# Patient Record
Sex: Female | Born: 1995 | Race: White | Hispanic: No | Smoking: Never smoker
Health system: Southern US, Community
[De-identification: ages and names within clinical notes are randomized; demographics above are authoritative.]

## PROBLEM LIST (undated history)

## (undated) DIAGNOSIS — N83209 Unspecified ovarian cyst, unspecified side: Secondary | ICD-10-CM

## (undated) DIAGNOSIS — I10 Essential (primary) hypertension: Secondary | ICD-10-CM

## (undated) DIAGNOSIS — F419 Anxiety disorder, unspecified: Secondary | ICD-10-CM

## (undated) DIAGNOSIS — K219 Gastro-esophageal reflux disease without esophagitis: Secondary | ICD-10-CM

## (undated) DIAGNOSIS — K5090 Crohn's disease, unspecified, without complications: Secondary | ICD-10-CM

## (undated) DIAGNOSIS — Z9889 Other specified postprocedural states: Secondary | ICD-10-CM

## (undated) DIAGNOSIS — R112 Nausea with vomiting, unspecified: Secondary | ICD-10-CM

## (undated) HISTORY — DX: Anxiety disorder, unspecified: F41.9

## (undated) HISTORY — PX: FRENULECTOMY, UPPER LABIAL: SHX1683

## (undated) HISTORY — DX: Nausea with vomiting, unspecified: R11.2

## (undated) HISTORY — PX: DRUG INDUCED ENDOSCOPY: SHX6808

## (undated) HISTORY — DX: Crohn's disease, unspecified, without complications: K50.90

## (undated) HISTORY — DX: Essential (primary) hypertension: I10

## (undated) HISTORY — DX: Nausea with vomiting, unspecified: Z98.890

## (undated) HISTORY — PX: COLONOSCOPY: SHX174

---
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
## 2013-12-01 ENCOUNTER — Telehealth: Payer: Self-pay | Admitting: Cardiology

## 2013-12-01 DIAGNOSIS — I119 Hypertensive heart disease without heart failure: Secondary | ICD-10-CM

## 2013-12-01 DIAGNOSIS — I10 Essential (primary) hypertension: Secondary | ICD-10-CM

## 2013-12-01 MED ORDER — METOPROLOL SUCCINATE ER 25 MG PO TB24: 25.0000 mg | ORAL_TABLET | Freq: Every day | ORAL | Status: DC

## 2013-12-01 NOTE — Telephone Encounter (Signed)
New Message  Pt states that her BP was fine for a while... However she has had terrible headaches recently and has found that her BP has risen to 134/108 as of last night.. Patient is requesting a call back.

## 2013-12-01 NOTE — Telephone Encounter (Signed)
Patient treated a couple of weeks ago for costochondritis with injection and Naproxen for about 4 days. Blood pressure has been good and last night went up. Today blood pressure 129/98 HR 119, last night HR 125. Patient continues to have headache. She states she has had a strange tingling/numb/pain feeling in legs. Will forward to  Dr. Mare Ferrari for review

## 2013-12-01 NOTE — Telephone Encounter (Signed)
Left message to call back  

## 2013-12-01 NOTE — Telephone Encounter (Signed)
When advised of  Dr. Sherryl Barters recommendation patient stated she had been off her Metoprolol for about a couple of months. Discussed with  Dr. Mare Ferrari and will have patient just restart her Metoprolol Succ 25 mg daily, Rx sent to pharmacy. Advised patient, verbalized understanding.

## 2013-12-01 NOTE — Telephone Encounter (Signed)
Would increase the Toprol to twice a day until blood pressure returns to normal.  The Naprosyn may have caused the blood pressure to go up temporarily.

## 2014-10-12 ENCOUNTER — Encounter (HOSPITAL_COMMUNITY): Payer: Self-pay | Admitting: *Deleted

## 2014-10-12 ENCOUNTER — Emergency Department (HOSPITAL_COMMUNITY): Payer: BLUE CROSS/BLUE SHIELD

## 2014-10-12 ENCOUNTER — Observation Stay (HOSPITAL_COMMUNITY)
Admission: EM | Admit: 2014-10-12 | Discharge: 2014-10-15 | Disposition: A | Discharge status: Home / Self Care | Payer: BLUE CROSS/BLUE SHIELD | Attending: Family Medicine | Admitting: Family Medicine

## 2014-10-12 DIAGNOSIS — Z79899 Other long term (current) drug therapy: Secondary | ICD-10-CM | POA: Insufficient documentation

## 2014-10-12 DIAGNOSIS — I10 Essential (primary) hypertension: Secondary | ICD-10-CM | POA: Diagnosis not present

## 2014-10-12 DIAGNOSIS — K219 Gastro-esophageal reflux disease without esophagitis: Secondary | ICD-10-CM | POA: Insufficient documentation

## 2014-10-12 DIAGNOSIS — K811 Chronic cholecystitis: Secondary | ICD-10-CM | POA: Diagnosis not present

## 2014-10-12 DIAGNOSIS — R1010 Upper abdominal pain, unspecified: Secondary | ICD-10-CM | POA: Diagnosis not present

## 2014-10-12 DIAGNOSIS — R911 Solitary pulmonary nodule: Secondary | ICD-10-CM | POA: Insufficient documentation

## 2014-10-12 DIAGNOSIS — R1011 Right upper quadrant pain: Secondary | ICD-10-CM | POA: Diagnosis not present

## 2014-10-12 DIAGNOSIS — R197 Diarrhea, unspecified: Secondary | ICD-10-CM | POA: Diagnosis present

## 2014-10-12 DIAGNOSIS — R112 Nausea with vomiting, unspecified: Secondary | ICD-10-CM

## 2014-10-12 HISTORY — DX: Gastro-esophageal reflux disease without esophagitis: K21.9

## 2014-10-12 HISTORY — DX: Unspecified ovarian cyst, unspecified side: N83.209

## 2014-10-12 LAB — CBC WITH DIFFERENTIAL/PLATELET
BASOS PCT: 0 % (ref 0–1)
Basophils Absolute: 0 10*3/uL (ref 0.0–0.1)
EOS ABS: 0 10*3/uL (ref 0.0–0.7)
EOS PCT: 0 % (ref 0–5)
HCT: 40.4 % (ref 36.0–46.0)
Hemoglobin: 13.5 g/dL (ref 12.0–15.0)
LYMPHS ABS: 1 10*3/uL (ref 0.7–4.0)
Lymphocytes Relative: 7 % — ABNORMAL LOW (ref 12–46)
MCH: 28.2 pg (ref 26.0–34.0)
MCHC: 33.4 g/dL (ref 30.0–36.0)
MCV: 84.5 fL (ref 78.0–100.0)
MONO ABS: 1 10*3/uL (ref 0.1–1.0)
MONOS PCT: 6 % (ref 3–12)
NEUTROS ABS: 12.9 10*3/uL — AB (ref 1.7–7.7)
Neutrophils Relative %: 87 % — ABNORMAL HIGH (ref 43–77)
PLATELETS: 338 10*3/uL (ref 150–400)
RBC: 4.78 MIL/uL (ref 3.87–5.11)
RDW: 13.2 % (ref 11.5–15.5)
WBC: 15 10*3/uL — ABNORMAL HIGH (ref 4.0–10.5)

## 2014-10-12 LAB — URINALYSIS, ROUTINE W REFLEX MICROSCOPIC
BILIRUBIN URINE: NEGATIVE
Glucose, UA: NEGATIVE mg/dL
HGB URINE DIPSTICK: NEGATIVE
Ketones, ur: >80 mg/dL — AB
Leukocytes, UA: NEGATIVE
NITRITE: NEGATIVE
PROTEIN: NEGATIVE mg/dL
SPECIFIC GRAVITY, URINE: 1.016 (ref 1.005–1.030)
Urobilinogen, UA: 0.2 mg/dL (ref 0.0–1.0)
pH: 7.5 (ref 5.0–8.0)

## 2014-10-12 LAB — RAPID URINE DRUG SCREEN, HOSP PERFORMED
AMPHETAMINES: NOT DETECTED
BENZODIAZEPINES: NOT DETECTED
Barbiturates: NOT DETECTED
COCAINE: NOT DETECTED
Opiates: POSITIVE — AB
Tetrahydrocannabinol: POSITIVE — AB

## 2014-10-12 LAB — COMPREHENSIVE METABOLIC PANEL
ALK PHOS: 62 U/L (ref 39–117)
ALT: 21 U/L (ref 0–35)
AST: 19 U/L (ref 0–37)
Albumin: 4.1 g/dL (ref 3.5–5.2)
Anion gap: 7 (ref 5–15)
BILIRUBIN TOTAL: 0.8 mg/dL (ref 0.3–1.2)
BUN: 8 mg/dL (ref 6–23)
CALCIUM: 9 mg/dL (ref 8.4–10.5)
CHLORIDE: 107 mmol/L (ref 96–112)
CO2: 24 mmol/L (ref 19–32)
CREATININE: 0.79 mg/dL (ref 0.50–1.10)
GFR calc Af Amer: >90 mL/min (ref >90)
Glucose, Bld: 102 mg/dL — ABNORMAL HIGH (ref 70–99)
Potassium: 3.8 mmol/L (ref 3.5–5.1)
Sodium: 138 mmol/L (ref 135–145)
Total Protein: 6.8 g/dL (ref 6.0–8.3)

## 2014-10-12 LAB — HCG, QUANTITATIVE, PREGNANCY

## 2014-10-12 LAB — LIPASE, BLOOD: Lipase: 32 U/L (ref 11–59)

## 2014-10-12 LAB — POC URINE PREG, ED: Preg Test, Ur: NEGATIVE

## 2014-10-12 MED ORDER — ACETAMINOPHEN 325 MG PO TABS
650.0000 mg | ORAL_TABLET | Freq: Four times a day (QID) | ORAL | Status: DC | PRN
  Administered 2014-10-13: 650 mg via ORAL
  Filled 2014-10-12: qty 2

## 2014-10-12 MED ORDER — SODIUM CHLORIDE 0.9 % IV SOLN
Freq: Once | INTRAVENOUS | Status: AC
Start: 2014-10-12 — End: 2014-10-12
  Administered 2014-10-12: 09:00:00 via INTRAVENOUS

## 2014-10-12 MED ORDER — BOOST / RESOURCE BREEZE PO LIQD
1.0000 | Freq: Three times a day (TID) | ORAL | Status: DC
  Administered 2014-10-12 – 2014-10-14 (×3): 1 via ORAL

## 2014-10-12 MED ORDER — PANTOPRAZOLE SODIUM 20 MG PO TBEC
20.0000 mg | DELAYED_RELEASE_TABLET | Freq: Every day | ORAL | Status: DC
  Administered 2014-10-12 – 2014-10-15 (×2): 20 mg via ORAL
  Filled 2014-10-12 (×4): qty 1

## 2014-10-12 MED ORDER — HYDROMORPHONE HCL 1 MG/ML IJ SOLN
1.0000 mg | Freq: Once | INTRAMUSCULAR | Status: AC
  Administered 2014-10-12: 1 mg via INTRAVENOUS
  Filled 2014-10-12: qty 1

## 2014-10-12 MED ORDER — ONDANSETRON HCL 4 MG PO TABS
4.0000 mg | ORAL_TABLET | Freq: Four times a day (QID) | ORAL | Status: DC | PRN
  Administered 2014-10-14: 4 mg via ORAL
  Filled 2014-10-12: qty 1

## 2014-10-12 MED ORDER — ALUM & MAG HYDROXIDE-SIMETH 200-200-20 MG/5ML PO SUSP: 30.0000 mL | Freq: Four times a day (QID) | ORAL | Status: DC | PRN

## 2014-10-12 MED ORDER — SODIUM CHLORIDE 0.9 % IV SOLN
INTRAVENOUS | Status: AC
  Administered 2014-10-12: 100 mL/h via INTRAVENOUS

## 2014-10-12 MED ORDER — ONDANSETRON HCL 4 MG/2ML IJ SOLN
4.0000 mg | Freq: Four times a day (QID) | INTRAMUSCULAR | Status: DC | PRN
  Administered 2014-10-13 – 2014-10-15 (×2): 4 mg via INTRAVENOUS
  Filled 2014-10-12 (×2): qty 2

## 2014-10-12 MED ORDER — MORPHINE SULFATE 4 MG/ML IJ SOLN
4.0000 mg | Freq: Once | INTRAMUSCULAR | Status: AC
  Administered 2014-10-12: 4 mg via INTRAVENOUS
  Filled 2014-10-12: qty 1

## 2014-10-12 MED ORDER — ACETAMINOPHEN 650 MG RE SUPP: 650.0000 mg | Freq: Four times a day (QID) | RECTAL | Status: DC | PRN

## 2014-10-12 MED ORDER — SODIUM CHLORIDE 0.9 % IV SOLN
Freq: Once | INTRAVENOUS | Status: AC
  Administered 2014-10-12: 13:00:00 via INTRAVENOUS

## 2014-10-12 MED ORDER — HEPARIN SODIUM (PORCINE) 5000 UNIT/ML IJ SOLN
5000.0000 [IU] | Freq: Three times a day (TID) | INTRAMUSCULAR | Status: DC
  Administered 2014-10-12 – 2014-10-14 (×4): 5000 [IU] via SUBCUTANEOUS
  Filled 2014-10-12 (×7): qty 1

## 2014-10-12 MED ORDER — METOCLOPRAMIDE HCL 5 MG/ML IJ SOLN
10.0000 mg | Freq: Once | INTRAMUSCULAR | Status: AC
  Administered 2014-10-12: 10 mg via INTRAVENOUS
  Filled 2014-10-12: qty 2

## 2014-10-12 MED ORDER — ONDANSETRON HCL 4 MG/2ML IJ SOLN
4.0000 mg | Freq: Once | INTRAMUSCULAR | Status: AC
Start: 2014-10-12 — End: 2014-10-12
  Administered 2014-10-12: 4 mg via INTRAVENOUS
  Filled 2014-10-12: qty 2

## 2014-10-12 MED ORDER — DICYCLOMINE HCL 10 MG PO CAPS
10.0000 mg | ORAL_CAPSULE | Freq: Three times a day (TID) | ORAL | Status: DC
  Administered 2014-10-12 – 2014-10-14 (×3): 10 mg via ORAL
  Filled 2014-10-12 (×11): qty 1

## 2014-10-12 MED ORDER — MORPHINE SULFATE 2 MG/ML IJ SOLN
1.0000 mg | INTRAMUSCULAR | Status: DC | PRN
  Administered 2014-10-12 – 2014-10-13 (×2): 1 mg via INTRAVENOUS
  Filled 2014-10-12 (×2): qty 1

## 2014-10-12 NOTE — ED Notes (Signed)
Pt able to tolerate fluids.  Denies N/V

## 2014-10-12 NOTE — ED Notes (Signed)
Returned from xray

## 2014-10-12 NOTE — Consult Note (Signed)
Referring Provider: No ref. provider found Primary Care Physician:  Karma Greaser, DO Primary Gastroenterologist:  Dr. Dorcas Mcmurray [facility] Medicine Teaching Service)  Reason for Consultation:  Nausea, vomiting, abdominal pain, diarrhea  HPI: Natalie Holloway is a 19 y.o. female admitted to the emergency room today because of a three-week history of the above symptoms.  The patient states that she's had stomach symptoms most of her life. Throughout high school, for example, she would only have one bowel movement per week, and it would often be liquid in character. For the past several years, she has had significant heartburn, both after meals and nocturnally, including regurgitation when she would bend over, as well as retrosternal pyrosis. This would be treated with over-the-counter antacids, such as Tums.  With that background, the patient became more acutely ill about 3 weeks ago. Since that time, she has not had any formed bowel movements, and has been having diarrhea with between 5 and 10 liquid nonbloody bowel movements per day. She has had upper abdominal pain, in the epigastric area, somewhat crampy or squeezing in character, more or less continuously present. During this period of time, she has been essentially unable to eat, and she has been having recurrent bilious vomiting.  The patient was seen in an outside emergency room 2 days ago, at which time a CT scan of the abdomen and pelvis was unrevealing. Because of ongoing symptoms, she came to the emergency room here today, where an abdominal ultrasound was negative for stones, white count was moderately elevated at 15,000, but other labs were normal including liver chemistries and lipase.  Stool studies to check for infectious pathogen such as C. difficile have not yet been obtained, but the patient does indicate she was treated briefly with antibiotics for about 5 days for bronchitis, approximately a month ago.  The patient points out that  her maternal grandmother, her maternal great-grandmother, and several maternal aunts have a history of gallbladder trouble. However, the patient's mother has not had any gallbladder issues.   Past Medical History  Diagnosis Date  . Essential hypertension     History reviewed. No pertinent past surgical history.  Prior to Admission medications   Medication Sig Start Date End Date Taking? Authorizing Provider  dicyclomine (BENTYL) 10 MG capsule Take 10 mg by mouth 3 (three) times daily. 09/27/14  Yes Historical Provider, MD  famotidine (PEPCID) 20 MG tablet Take 20 mg by mouth 2 (two) times daily. 10/10/14 10/10/15 Yes Historical Provider, MD  omeprazole (PRILOSEC) 10 MG capsule Take 10 mg by mouth daily.   Yes Historical Provider, MD  promethazine (PHENERGAN) 25 MG tablet Take 25 mg by mouth every 8 (eight) hours. 10/10/14  Yes Historical Provider, MD  metoprolol succinate (TOPROL XL) 25 MG 24 hr tablet Take 1 tablet (25 mg total) by mouth daily. Patient not taking: Reported on 10/12/2014 12/01/13   Darlin Coco, MD    Current Facility-Administered Medications  Medication Dose Route Frequency Provider Last Rate Last Dose  . 0.9 %  sodium chloride infusion   Intravenous Continuous Lorna Few, DO 100 mL/hr at 10/12/14 1815 100 mL/hr at 10/12/14 1815  . acetaminophen (TYLENOL) tablet 650 mg  650 mg Oral Q6H PRN Lorna Few, DO       Or  . acetaminophen (TYLENOL) suppository 650 mg  650 mg Rectal Q6H PRN [place] N Rumley, DO      . alum & mag hydroxide-simeth (MAALOX/MYLANTA) 200-200-20 MG/5ML suspension 30 mL  30 mL Oral Q6H  PRN [employer], DO      . dicyclomine (BENTYL) capsule 10 mg  10 mg Oral TID [facility], DO   10 mg at 10/12/14 1800  . heparin injection 5,000 Units  5,000 Units Subcutaneous 3 times per day [facility], DO      . morphine 2 MG/ML injection 1 mg  1 mg Intravenous Q3H PRN Lorna Few, DO   1 mg at 10/12/14 1841  . ondansetron (ZOFRAN) tablet  4 mg  4 mg Oral Q6H PRN [place] N Rumley, DO       Or  . ondansetron (ZOFRAN) injection 4 mg  4 mg Intravenous Q6H PRN [place] N Rumley, DO      . pantoprazole (PROTONIX) EC tablet 20 mg  20 mg Oral Daily [place], DO        Allergies as of 10/12/2014  . (No Known Allergies)    No family history on file.  History   Social History  . Marital Status: Single    Spouse Name: N/A    Number of Children: N/A  . Years of Education: N/A   Occupational History  . Not on file.   Social History Main Topics  . Smoking status: Never Smoker   . Smokeless tobacco: Not on file  . Alcohol Use: No  . Drug Use: No  . Sexual Activity: Not on file   Other Topics Concern  . Not on file   Social History Narrative    Review of Systems: Please see history of present illness  Physical Exam: Vital signs in last 24 hours: Temp:  [97.8 F (36.6 C)-99.3 F (37.4 C)] 99.3 F (37.4 C) (02/03 1826) Pulse Rate:  [68-102] 81 (02/03 1826) Resp:  [14-22] 16 (02/03 1826) BP: (110-146)/(66-99) 120/79 mmHg (02/03 1826) SpO2:  [97 %-100 %] 99 % (02/03 1826) Weight:  [73.8 kg (162 lb 11.2 oz)] 73.8 kg (162 lb 11.2 oz) (02/03 1826) Last BM Date: 10/12/14 General:   Alert,  Well-developed, well-nourished, pleasant and cooperative in NAD Head:  Normocephalic and atraumatic. Eyes:  Sclera clear, no icterus.   Conjunctiva pink. Mouth:   No ulcerations or lesions.  Oropharynx pink & moist. Neck:   No masses or thyromegaly. Lungs:  Clear throughout to auscultation.   No wheezes, crackles, or rhonchi. No evident respiratory distress. Heart:   Regular rate and rhythm; no murmurs, clicks, rubs,  or gallops. Abdomen:  Soft, but with mild subjective upper abdominal tenderness, nontympanitic, and nondistended. No masses, hepatosplenomegaly or ventral hernias noted. Quiet bowel sounds s/p morphine, without bruits, guarding, or rebound.   Msk:   Symmetrical without gross deformities. Pulses:  Normal radial  pulses noted. Extremities:   Without clubbing, cyanosis, or edema. Neurologic:  Alert and coherent;  grossly normal neurologically. Skin:  Intact without significant lesions or rashes. Cervical Nodes:  No significant cervical adenopathy. Psych:   Alert and cooperative. Normal mood and affect.  Intake/Output from previous day:   Intake/Output this shift:    Lab Results:  Recent Labs  10/12/14 0920  WBC 15.0*  HGB 13.5  HCT 40.4  PLT 338   BMET  Recent Labs  10/12/14 1235  NA 138  K 3.8  CL 107  CO2 24  GLUCOSE 102*  BUN 8  CREATININE 0.79  CALCIUM 9.0   LFT  Recent Labs  10/12/14 1235  PROT 6.8  ALBUMIN 4.1  AST 19  ALT 21  ALKPHOS 62  BILITOT 0.8  PT/INR No results for input(s): LABPROT, INR in the last 72 hours.  Studies/Results: Dg Chest 2 View  10/12/2014   CLINICAL DATA:  generalized abd pain and nausea for 3 weeks. Worse for the past few days. She was seen at [facility] and told that it was her gallbladder.  EXAM: CHEST - 2 VIEW  COMPARISON:  08/21/2012  FINDINGS: Lungs are clear. Heart size and mediastinal contours are within normal limits. No effusion. Visualized skeletal structures are unremarkable.  IMPRESSION: No acute cardiopulmonary disease.   Electronically Signed   By: Arne Cleveland M.D.   On: 10/12/2014 12:21   US Abdomen Complete  10/12/2014   CLINICAL DATA:  Right upper quadrant abdominal pain for 3 weeks with nausea and vomiting. Initial encounter.  EXAM: ULTRASOUND ABDOMEN COMPLETE  COMPARISON:  Abdominal ultrasound and CT 10/04/2010.  FINDINGS: Gallbladder: No gallstones or wall thickening visualized. No sonographic Murphy sign noted.  Common bile duct: Diameter: 1.8 mm. No evidence of choledocholithiasis.  Liver: No focal lesion identified. Within normal limits in parenchymal echogenicity.  IVC: No abnormality visualized.  Pancreas: Visualized portion unremarkable.  Spleen: Size and appearance within normal limits.  Right Kidney: Length: 11.1  cm. Echogenicity within normal limits. No mass or hydronephrosis visualized.  Left Kidney: Length: 11.8 cm. Echogenicity within normal limits. No mass or hydronephrosis visualized.  Abdominal aorta: No aneurysm visualized.  Other findings: None.  IMPRESSION: Normal abdominal ultrasound.   Electronically Signed   By: Camie Patience M.D.   On: 10/12/2014 11:15    Impression: 1. Nausea and vomiting (bilious, not food related, but exacerbated by eating) 2. Upper abdominal pain 3. Diarrhea of 3 weeks' duration, nonbloody 4. History of reflux, with both heartburn and regurgitation 5. Previous history of significant constipation characterized by in frequent bowel movements  Discussion:  I am having trouble putting all of this patient's clinical presentation into a single, a unifying diagnosis. For example, one might wonder about gallbladder disease to account for her upper abdominal pain, but going against that is her young age, the absence of stones on ultrasound, and the persistence of symptoms rather than sporadic her episodic attacks. One might wonder about C. difficile enterocolitis which could give her nausea, vomiting, and diarrhea, but going against that is the minimal antecedent exposure to antibiotics and the absence of colitis on her outside CT scan.  Plan: 1. Obtain beta hCG pregnancy test as confirmation of the absence of pregnancy to account for her nausea and vomiting 2. CCK stimulated hepatobiliary scan tomorrow morning to help look for possible gallbladder dysfunction as a cause of her symptoms (acalculous biliary tract disease, biliary dyskinesia, chronic cholecystitis, cholesterolosis). 3. Await results of stool studies for Clostridium difficile and enteric pathogens 4. If the above studies are unrevealing, consider endoscopic and possibly colonoscopic evaluation with random mucosal biopsies. For example, I once saw patient with a somewhat similar presentation who had lymphocytic  gastroenteritis and lymphocytic colitis presenting in a somewhat similar fashion.   LOS: 0 days   Ennio Houp V  10/12/2014, 7:29 PM

## 2014-10-12 NOTE — H&P (Signed)
[facility] Admission History and Physical Service Pager: [phone]  Patient name: Natalie Holloway Medical record number: [id number] Date of birth: [birth year] Age: 19 y.o. Gender: female  Primary Care Provider: Karma Greaser, DO Consultants: Gastroenterology Code Status: Full  Chief Complaint: Abdominal Pain/Nausea/Vomiting/Diarrhea  Assessment and Plan: Natalie Holloway is a 19 y.o. female presenting with RUQ abdominal pain, nausea, vomiting, and diarrhea. PMH is significant for HTN, which has since resolved (thought to be d/t OCPs), GERD. Note - significant reported family history of GB disease.  # RUQ Abdominal Pain / Nausea / Vomiting: Three week history worse over last three days, not tolerating any PO. Clinically mild dehydration, uncomfortable but some improvement s/p IVF and pain control in ER. Vitals normal. Electrolytes normal. Glucose 102 and ketones in urine. WBC 15. Urine pregnancy negative. Urinalysis negative. CXR with no acute cardiopulmonary disease. US abdomen normal (no gallstones or wall thickening, neg [place] murphy's). Recently seen at [facility] ER on 2/1 with WBC 13.7, Glucose elevated at 139, ketones in urine, lipase normal, and anion gap 20. CT Abd/Pelvis showed no findings for acute abdominal pain (no gallstones) but also showed a small 18m pulmonary nodule with recommendations to follow up in 12 months; no calculi or hydronephrosis noted. Overall, suspected biliary disease, however unlikely cholecystitis and no cholelithiaisis identified. Possible gastroenteritis with some loose stools (>3 wk course and localized RUQ pain seems unlikely). - Admit to observation - Ordered confirmatory Beta HCG serum pregnancy test to confirm (urine preg negative) - Clear liquid diet as tolerated, NPO after MN - Follow up am labs - Follow up UDS (denied drug use, check THC for recurrent n/v) - Bentyl 146mTID - Protonix 2047m Tylenol PRN fever/mild pain. Morphine  1q3hr PRN moderate to severe pain. - Zofran PRN nausea, Maalox/Mylanta PRN indigestion - GI consulted. Appreciate recommendations. - [facility] - To see patient tonight 10/12/14. Given persistent typical biliary symptoms with negative work-up recently, especially no gallstones on US,Kore[place]commend pursuing HIDA scan to evaluate for acalculus biliary disease / GB function. Per request, ordered HIDA for 2/4 at 0800 (NPO after MN, avoid opiates 6 hr prior to test), if abnormal, given significant family hx biliary disease, would likely recommend surgical consultation for possible cholecystectomy.  FEN/GI: Clear Liquid Diet >> NPO after MN, NS @ 100 (s/p 2L IVF in ER) Prophylaxis: SQ Heparin  Disposition: Placed in Observation. Discharge home pending improvement of symptoms.  History of Present Illness: Natalie Holloway a [age]o. female presenting with three week history of abdominal pain, diarrhea, nausea, and vomiting worse over the last two days. Describes abdominal pain as sharp in nature, located in RUQ and epigastric area and penetrating to her back. Pain is non-radiating. States pain was intermittent until Monday (10/10/14) when it became constant. Describes vomitus as bilious with one episode of coffee ground color a few days ago. Denies frank blood. Symptoms improved yesterday with clear liquid diet, however they returned this morning with food. Denies history of sick contacts. Denies history of fever, however admits to chills today, dysuria, hematuria. Has not been able to keep anything down today. Was evaluated by nurse at her school who believed she may have IBS. Denies history of alcohol, drug, or tobacco use. Last menstrual period two weeks ago. Strong family history of gallbladder problems.  [facility]. Instructed to follow up with GI and try clear liquid diet until symptoms  resolved.  Review Of Systems: Per HPI   Otherwise 12 point review of systems was performed and was unremarkable.  Patient Active Problem List   Diagnosis Date Noted  . Essential hypertension 08/21/2012   Past Medical History: Past Medical History  Diagnosis Date  . Essential hypertension    Past Surgical History: History reviewed. No pertinent past surgical history. Social History: History  Substance Use Topics  . Smoking status: Never Smoker   . Smokeless tobacco: Not on file  . Alcohol Use: No   Please also refer to relevant sections of EMR.  Family History: No family history on file. Allergies and Medications: No Known Allergies No current facility-administered medications on file prior to encounter.   Current Outpatient Prescriptions on File Prior to Encounter  Medication Sig Dispense Refill  . metoprolol succinate (TOPROL XL) 25 MG 24 hr tablet Take 1 tablet (25 mg total) by mouth daily. (Patient not taking: Reported on 10/12/2014) 30 tablet 5    Objective: BP 121/75 mmHg  Pulse 94  Temp(Src) 97.8 F (36.6 C) (Oral)  Resp 14  SpO2 97%  LMP 09/28/2014 Exam: General: 19yo female resting comfortably in no apparent distress HEENT: Mild dry mucous membranes. PERRLA. Cardiovascular: S1 and S2 noted. No murmurs/rubs/gallops. Regular rate and rhythm. Respiratory: Clear to auscultation bilaterally. No wheezes noted. No increased work of breathing. Abdomen: Bowel sounds noted. Soft and nondistended. Tenderness in RUQ and epigastric area, equivocal Murphy's sign w/o overt worsening pain. No rebound tenderness or Rovsing's sign noted. Right CVA tenderness.  Extremities: No edema noted. Pulses palpated. Skin: No rashes noted. Warm. Neuro: No focal deficits noted. AAO x4  Labs and Imaging: CBC BMET   Recent Labs Lab 10/12/14 0920  WBC 15.0*  HGB 13.5  HCT 40.4  PLT 338    Recent Labs Lab 10/12/14 1235  NA 138  K 3.8  CL 107  CO2 24  BUN 8  CREATININE 0.79  GLUCOSE 102*  CALCIUM 9.0     . Urinalysis    Component Value Date/Time   COLORURINE YELLOW 10/12/2014 1017   APPEARANCEUR CLEAR 10/12/2014 1017   LABSPEC 1.016 10/12/2014 1017   PHURINE 7.5 10/12/2014 1017   GLUCOSEU NEGATIVE 10/12/2014 1017   HGBUR NEGATIVE 10/12/2014 1017   BILIRUBINUR NEGATIVE 10/12/2014 1017   KETONESUR >80* 10/12/2014 1017   PROTEINUR NEGATIVE 10/12/2014 1017   UROBILINOGEN 0.2 10/12/2014 1017   NITRITE NEGATIVE 10/12/2014 1017   LEUKOCYTESUR NEGATIVE 10/12/2014 1017  - Urine pregnancy negative  10/12/14 [place] Abd (complete) IMPRESSION: Normal abdominal ultrasound.  10/12/14 CXR 2v IMPRESSION: No acute cardiopulmonary disease.  [place], [place] 10/12/2014, 4:03 PM PGY-1, [place] Intern pager: [phone], text pages welcome  Upper Level Addendum:  I have seen and evaluated this patient along with Dr. Gerlean Ren and reviewed the above note, making necessary revisions in purple.

## 2014-10-12 NOTE — ED Notes (Signed)
Pt ambulated to the bathroom without assistance.

## 2014-10-12 NOTE — ED Notes (Signed)
Gave pt water to drink for PO trial

## 2014-10-12 NOTE — ED Notes (Signed)
Off unit with Xray.

## 2014-10-12 NOTE — ED Notes (Signed)
Patient states she has abd pain and nausea for 3 weeks.  Worse for the past few days.  She was seen at [facility] and told that it was her gallbladder  Patient pain has increased and she is tearful.  Patient has been eating bland diet.  Patient has had 5-6 emesis today.  Patient states her emesis is green.  Patient has also had diarrhea,.  Patient has not had anything for pain

## 2014-10-12 NOTE — Progress Notes (Signed)
Natalie Holloway [id number] Admission Data: 10/12/2014 6:35 PM Attending Provider: Dickie La, MD  TVG:[id number],YOOJZ, DO Consults/ Treatment Team:    Natalie Holloway is a 19 y.o. female patient admitted from ED awake, alert  & orientated  X 3,  Full Code, VSS - Blood pressure 120/79, pulse 81, temperature 99.3 F (37.4 C), temperature source Oral, resp. rate 16, height 5' 2"  (1.575 m), weight 73.8 kg (162 lb 11.2 oz), last menstrual period 09/28/2014, SpO2 99 %.,  no c/o shortness of breath, no c/o chest pain, no distress noted.   IV site WDL:  Right hand running NS at 130m/hour.  Allergies:  No Known Allergies   Past Medical History  Diagnosis Date  . Essential hypertension       Pt orientation to unit, room and routine. Information packet given to patient/family and safety video watched.  Admission INP armband ID verified with patient/family, and in place. SR up x 2, fall risk assessment complete with Patient and family verbalizing understanding of risks associated with falls. Pt verbalizes an understanding of how to use the call bell and to call for help before getting out of bed.  Skin, clean-dry- intact without evidence of bruising, or skin tears.   No evidence of skin break down noted on exam.     Will cont to monitor and assist as needed.  BDayle Points RN 10/12/2014 6:35 PM

## 2014-10-12 NOTE — ED Notes (Signed)
Patient continues to have dry heaves and pain.  Patient mother is at bedside.  Patient ready to attempt to provide urine specimen

## 2014-10-12 NOTE — ED Provider Notes (Signed)
CSN: [id number]     Arrival date & time 10/12/14  0900 History   First MD Initiated Contact with Patient 10/12/14 [phone]     Chief Complaint  Patient presents with  . Abdominal Pain  . Emesis     (Consider location/radiation/quality/duration/timing/severity/associated sxs/prior Treatment) HPI   19 year old female with history of essential hypertension who presents for evaluation of abdominal pain. Patient has had intermittent right upper quadrant abdominal pain and nausea for the past 3 weeks. Pain is described as a sharp sensation, radiates to her back, worsening with eating. She endorsed feeling nauseous, and has vomited multiple times. Vomitus is bilious in content, nonbloody. Also endorsed having loose stools without any blood or mucus. Endorsed chills, lightheadedness and dizziness. She was seen at an outside hospital 2 days ago for this complaint. She had an abdominal CT scan and was told that her pain is likely gallbladder related. She was discharge with antinausea medication and pain medication and a referral to GI. Her appointment is 2 weeks later however her symptoms have progressively worse. She is unable to keep any of her medication down. She is here with a 10 out of 10 pain and persistent nausea and vomiting. She is dry heaving. Denies any abdominal surgery, last menstrual period was 2 weeks ago. Denies any dysuria, hematuria, vaginal bleeding or vaginal discharge.  Past Medical History  Diagnosis Date  . Essential hypertension    History reviewed. No pertinent past surgical history. No family history on file. History  Substance Use Topics  . Smoking status: Never Smoker   . Smokeless tobacco: Not on file  . Alcohol Use: No   OB History    No data available     Review of Systems  All other systems reviewed and are negative.     Allergies  Review of patient's allergies indicates no known allergies.  Home Medications   Prior to Admission medications   Medication  Sig Start Date End Date Taking? Authorizing Provider  metoprolol succinate (TOPROL XL) 25 MG 24 hr tablet Take 1 tablet (25 mg total) by mouth daily. 12/01/13   Darlin Coco, MD   BP 130/87 mmHg  Pulse 99  Temp(Src) 97.8 F (36.6 C) (Oral)  Resp 22  SpO2 100% Physical Exam  Constitutional: She is oriented to person, place, and time. She appears well-developed and well-nourished.  Caucasian female, appears very uncomfortable actively vomiting.  HENT:  Head: Atraumatic.  Eyes: Conjunctivae are normal.  Neck: Neck supple.  Cardiovascular: Normal rate and regular rhythm.   Pulmonary/Chest: Effort normal and breath sounds normal. She exhibits no tenderness.  Abdominal: Soft. There is tenderness (Diffuse abdominal tenderness most significant to right upper quadrant with guarding but without rebound tenderness.). There is guarding. There is no rebound.  Decreased bowel sounds.  Genitourinary:  No CVA tenderness.  Neurological: She is alert and oriented to person, place, and time.  Skin: No rash noted.  Psychiatric: She has a normal mood and affect.  Nursing note and vitals reviewed.   ED Course  Procedures (including critical care time)  Patient with right upper quadrant abdominal pain concerning for biliary disease. She was seen at an outside hospital 2 days ago and had an abdominal and pelvis CT scan that shows no acute finding. There was no documentation on her gallbladder on the CT scan. Therefore, we'll obtain abdominal ultrasound for further evaluation. Workup initiated, pain medication and antinausea medication given along with IV fluid.  Care discussed with Dr. Wyvonnia Dusky  1:40 PM  Patient with leukocytosis, WBC of 15 with left shift. She has greater than 80 ketones in urine. The remainder of her labs are reassuring. Chest x-ray without acute abnormalities, ultrasound of abdomen shows no evidence of gallbladder etiology, no other findings noted.  3:32 PM Although patient felt a  bit better after receiving treatments and able to tolerance by mouth. She does not feel comfortable going home stating that her symptoms likely worsen as he did when she was discharged from ER 2 days ago for the same complaint. She also mentioned that her grandmother had an episode of gangrene is biliary disease that was undetected on initial exam and subsequently she got really sick from that. Therefore there was some concern of her symptoms.  3:40 PM i have consulted Family Medicine Resident who agrees to see pt in ER and will determine disposition.     Labs Review Labs Reviewed  CBC WITH DIFFERENTIAL/PLATELET - Abnormal; Notable for the following:    WBC 15.0 (*)    Neutrophils Relative % 87 (*)    Neutro Abs 12.9 (*)    Lymphocytes Relative 7 (*)    All other components within normal limits  URINALYSIS, ROUTINE W REFLEX MICROSCOPIC - Abnormal; Notable for the following:    Ketones, ur >80 (*)    All other components within normal limits  COMPREHENSIVE METABOLIC PANEL - Abnormal; Notable for the following:    Glucose, Bld 102 (*)    All other components within normal limits  LIPASE, BLOOD  POC URINE PREG, ED    Imaging Review Dg Chest 2 View  10/12/2014   CLINICAL DATA:  generalized abd pain and nausea for 3 weeks. Worse for the past few days. She was seen at [facility] and told that it was her gallbladder.  EXAM: CHEST - 2 VIEW  COMPARISON:  08/21/2012  FINDINGS: Lungs are clear. Heart size and mediastinal contours are within normal limits. No effusion. Visualized skeletal structures are unremarkable.  IMPRESSION: No acute cardiopulmonary disease.   Electronically Signed   By: Arne Cleveland M.D.   On: 10/12/2014 12:21   US Abdomen Complete  10/12/2014   CLINICAL DATA:  Right upper quadrant abdominal pain for 3 weeks with nausea and vomiting. Initial encounter.  EXAM: ULTRASOUND ABDOMEN COMPLETE  COMPARISON:  Abdominal ultrasound and CT 10/04/2010.  FINDINGS: Gallbladder: No gallstones  or wall thickening visualized. No sonographic Murphy sign noted.  Common bile duct: Diameter: 1.8 mm. No evidence of choledocholithiasis.  Liver: No focal lesion identified. Within normal limits in parenchymal echogenicity.  IVC: No abnormality visualized.  Pancreas: Visualized portion unremarkable.  Spleen: Size and appearance within normal limits.  Right Kidney: Length: 11.1 cm. Echogenicity within normal limits. No mass or hydronephrosis visualized.  Left Kidney: Length: 11.8 cm. Echogenicity within normal limits. No mass or hydronephrosis visualized.  Abdominal aorta: No aneurysm visualized.  Other findings: None.  IMPRESSION: Normal abdominal ultrasound.   Electronically Signed   By: Camie Patience M.D.   On: 10/12/2014 11:15     EKG Interpretation None      MDM   Final diagnoses:  RUQ abdominal pain  Upper abdominal pain    BP 128/80 mmHg  Pulse 92  Temp(Src) 97.8 F (36.6 C) (Oral)  Resp 14  SpO2 99%  LMP 09/28/2014  I have reviewed nursing notes and vital signs. I personally reviewed the imaging tests through PACS system  I reviewed available ER/hospitalization records thought the EMR     Domenic Moras, [place] 10/12/14 1638  Ezequiel Essex, MD 10/12/14 [phone]

## 2014-10-13 ENCOUNTER — Observation Stay (HOSPITAL_COMMUNITY): Payer: BLUE CROSS/BLUE SHIELD

## 2014-10-13 DIAGNOSIS — R197 Diarrhea, unspecified: Secondary | ICD-10-CM | POA: Diagnosis not present

## 2014-10-13 DIAGNOSIS — R1011 Right upper quadrant pain: Secondary | ICD-10-CM | POA: Diagnosis not present

## 2014-10-13 LAB — CBC
HEMATOCRIT: 35.4 % — AB (ref 36.0–46.0)
Hemoglobin: 12 g/dL (ref 12.0–15.0)
MCH: 27.9 pg (ref 26.0–34.0)
MCHC: 33.9 g/dL (ref 30.0–36.0)
MCV: 82.3 fL (ref 78.0–100.0)
PLATELETS: 280 10*3/uL (ref 150–400)
RBC: 4.3 MIL/uL (ref 3.87–5.11)
RDW: 12.9 % (ref 11.5–15.5)
WBC: 7.3 10*3/uL (ref 4.0–10.5)

## 2014-10-13 LAB — BASIC METABOLIC PANEL
Anion gap: 9 (ref 5–15)
BUN: 6 mg/dL (ref 6–23)
CHLORIDE: 107 mmol/L (ref 96–112)
CO2: 22 mmol/L (ref 19–32)
Calcium: 8.8 mg/dL (ref 8.4–10.5)
Creatinine, Ser: 0.75 mg/dL (ref 0.50–1.10)
GFR calc non Af Amer: >90 mL/min (ref >90)
Glucose, Bld: 94 mg/dL (ref 70–99)
POTASSIUM: 3.5 mmol/L (ref 3.5–5.1)
Sodium: 138 mmol/L (ref 135–145)

## 2014-10-13 LAB — CLOSTRIDIUM DIFFICILE BY PCR: Toxigenic C. Difficile by PCR: NEGATIVE

## 2014-10-13 MED ORDER — TECHNETIUM TC 99M MEBROFENIN IV KIT
5.0000 | PACK | Freq: Once | INTRAVENOUS | Status: AC | PRN
  Administered 2014-10-13: 5 via INTRAVENOUS

## 2014-10-13 MED ORDER — SINCALIDE 5 MCG IJ SOLR
0.0200 ug/kg | Freq: Once | INTRAMUSCULAR | Status: AC
  Administered 2014-10-13: 3.86 ug via INTRAVENOUS

## 2014-10-13 MED ORDER — SINCALIDE 5 MCG IJ SOLR
INTRAMUSCULAR | Status: AC
  Filled 2014-10-13: qty 5

## 2014-10-13 MED ORDER — SODIUM CHLORIDE 0.9 % IV SOLN
INTRAVENOUS | Status: DC
  Administered 2014-10-13: 23:00:00 via INTRAVENOUS

## 2014-10-13 MED ORDER — STERILE WATER FOR INJECTION IJ SOLN
INTRAMUSCULAR | Status: AC
  Administered 2014-10-13: 5 mL
  Filled 2014-10-13: qty 10

## 2014-10-13 MED ORDER — MORPHINE SULFATE 2 MG/ML IJ SOLN
1.0000 mg | INTRAMUSCULAR | Status: DC | PRN
  Administered 2014-10-13 – 2014-10-14 (×4): 1 mg via INTRAVENOUS
  Filled 2014-10-13 (×5): qty 1

## 2014-10-13 NOTE — Progress Notes (Signed)
INITIAL NUTRITION ASSESSMENT  DOCUMENTATION CODES Per approved criteria  -Not Applicable   INTERVENTION: 1. Monitor intake and provide supplements/snacks as warranted  NUTRITION DIAGNOSIS: Inadequate oral intake related to GI distress and upper gastric pain as evidenced by eating ~50-75% of normal intake  Goal: Pt to meet >/= 90% of their estimated nutrition needs   Monitor:  GI distress, test results, diet status, oral intake/output, labs, weight  Reason for Assessment: MST of 2   19 y.o. female  Admitting Dx: <principal problem not specified>  ASSESSMENT: Natalie Holloway is a 19 y.o. female presenting with RUQ abdominal pain, nausea, vomiting, and diarrhea. PMH is significant for HTN, which has since resolved    Pt was not in room when RD arrived; she was in procedure. Spoke with mother and boyfriend. Pt has been eating slightly worse than she regularly was in the past couple weeks. Boyfriend said she only ate half of meals, but mother said this is how she has always been and her overall intake is only slightly worse.  She would get sick when eating too much and would either have pains or diarrhea. Pt would also have episodes of vomiting, especially in the morning. Though she hasnt been eating as much as usual the mother reports that pts weight has remained fairly stable and she has actually gained weight over the past year. Pt not taking any supplements or following any diets at home.   Nutrition Focused Physical Exam: Pt not present   Height: Ht Readings from Last 1 Encounters:  10/12/14 5' 2"  (1.575 m)    Weight: Wt Readings from Last 1 Encounters:  10/12/14 162 lb 11.2 oz (73.8 kg)    Ideal Body Weight: 110 lbs  % Ideal Body Weight: 147%  Wt Readings from Last 10 Encounters:  10/12/14 162 lb 11.2 oz (73.8 kg)  09/18/12 157 lb 12.8 oz (71.578 kg) (88 %*, Z = 1.18)  08/21/12 154 lb (69.854 kg) (86 %*, Z = 1.08)   * Growth percentiles are based on CDC 2-20 Years  data.    Usual Body Weight: ~162  % Usual Body Weight: 100  BMI:  Body mass index is 29.75 kg/(m^2).  Estimated Nutritional Needs: Kcal: 1475-1850 Protein: 74-89 Fluid: >1475 and  Enough to replace fluid lost from diarrhea  Skin: WDL  Diet Order: Diet NPO time specified Except for: Ice Chips  EDUCATION NEEDS: -No education needs identified at this time   Intake/Output Summary (Last 24 hours) at 10/13/14 1148 Last data filed at 10/13/14 0435  Gross per 24 hour  Intake 3061.67 ml  Output    476 ml  Net 2585.67 ml    Last BM: 2/3 diarrhea  Labs:   Recent Labs Lab 10/12/14 1235 10/13/14 0719  NA 138 138  K 3.8 3.5  CL 107 107  CO2 24 22  BUN 8 6  CREATININE 0.79 0.75  CALCIUM 9.0 8.8  GLUCOSE 102* 94    CBG (last 3)  No results for input(s): GLUCAP in the last 72 hours.  Scheduled Meds: . dicyclomine  10 mg Oral TID  . feeding supplement (RESOURCE BREEZE)  1 Container Oral TID BM  . heparin  5,000 Units Subcutaneous 3 times per day  . pantoprazole  20 mg Oral Daily    Continuous Infusions:   Past Medical History  Diagnosis Date  . Essential hypertension     History reviewed. No pertinent past surgical history.  Burtis Junes RD, LDN Nutrition 10/13/2014 11:48 AM  [id number]   

## 2014-10-13 NOTE — Progress Notes (Signed)
[facility] Progress Note  Subjective: Still with epigastric abdominal pain, slightly improved   Objective: Vital signs in last 24 hours: Temp:  [99.2 F (37.3 C)-99.3 F (37.4 C)] 99.2 F (37.3 C) (02/04 0511) Pulse Rate:  [68-102] 87 (02/04 0511) Resp:  [14-20] 18 (02/04 0511) BP: (110-141)/(66-92) 119/74 mmHg (02/04 0511) SpO2:  [97 %-100 %] 98 % (02/04 0511) Weight:  [73.8 kg (162 lb 11.2 oz)] 73.8 kg (162 lb 11.2 oz) (02/03 1826) Weight change:    PE: Abdomen soft mild tenderness in the epigastrium  Lab Results: Results for orders placed or performed during the hospital encounter of 10/12/14 (from the past 24 hour(s))  Comprehensive metabolic panel     Status: Abnormal   Collection Time: 10/12/14 12:35 PM  Result Value Ref Range   Sodium 138 135 - 145 mmol/L   Potassium 3.8 3.5 - 5.1 mmol/L   Chloride 107 96 - 112 mmol/L   CO2 24 19 - 32 mmol/L   Glucose, Bld 102 (H) 70 - 99 mg/dL   BUN 8 6 - 23 mg/dL   Creatinine, Ser 0.79 0.50 - 1.10 mg/dL   Calcium 9.0 8.4 - 10.5 mg/dL   Total Protein 6.8 6.0 - 8.3 g/dL   Albumin 4.1 3.5 - 5.2 g/dL   AST 19 0 - 37 U/L   ALT 21 0 - 35 U/L   Alkaline Phosphatase 62 39 - 117 U/L   Total Bilirubin 0.8 0.3 - 1.2 mg/dL   GFR calc non Af Amer >90 >90 mL/min   GFR calc Af Amer >90 >90 mL/min   Anion gap 7 5 - 15  Lipase, blood     Status: None   Collection Time: 10/12/14 12:35 PM  Result Value Ref Range   Lipase 32 11 - 59 U/L  hCG, quantitative, pregnancy     Status: None   Collection Time: 10/12/14  1:00 PM  Result Value Ref Range   hCG, Beta Chain, Quant, S <1 <5 mIU/mL  Clostridium Difficile by PCR     Status: None   Collection Time: 10/13/14  4:47 AM  Result Value Ref Range   C difficile by pcr NEGATIVE NEGATIVE  Basic metabolic panel     Status: None   Collection Time: 10/13/14  7:19 AM  Result Value Ref Range   Sodium 138 135 - 145 mmol/L   Potassium 3.5 3.5 - 5.1 mmol/L   Chloride 107 96 - 112 mmol/L   CO2 22 19 - 32 mmol/L   Glucose, Bld 94 70 - 99 mg/dL   BUN 6 6 - 23 mg/dL   Creatinine, Ser 0.75 0.50 - 1.10 mg/dL   Calcium 8.8 8.4 - 10.5 mg/dL   GFR calc non Af Amer >90 >90 mL/min   GFR calc Af Amer >90 >90 mL/min   Anion gap 9 5 - 15  CBC     Status: Abnormal   Collection Time: 10/13/14  7:19 AM  Result Value Ref Range   WBC 7.3 4.0 - 10.5 K/uL   RBC 4.30 3.87 - 5.11 MIL/uL   Hemoglobin 12.0 12.0 - 15.0 g/dL   HCT 35.4 (L) 36.0 - 46.0 %   MCV 82.3 78.0 - 100.0 fL   MCH 27.9 26.0 - 34.0 pg   MCHC 33.9 30.0 - 36.0 g/dL   RDW 12.9 11.5 - 15.5 %   Platelets 280 150 - 400 K/uL    Studies/Results: Dg Chest 2 View  10/12/2014   CLINICAL DATA:  generalized abd pain and nausea for 3 weeks. Worse for the past few days. She was seen at [facility] and told that it was her gallbladder.  EXAM: CHEST - 2 VIEW  COMPARISON:  08/21/2012  FINDINGS: Lungs are clear. Heart size and mediastinal contours are within normal limits. No effusion. Visualized skeletal structures are unremarkable.  IMPRESSION: No acute cardiopulmonary disease.   Electronically Signed   By: Arne Cleveland M.D.   On: 10/12/2014 12:21   US Abdomen Complete  10/12/2014   CLINICAL DATA:  Right upper quadrant abdominal pain for 3 weeks with nausea and vomiting. Initial encounter.  EXAM: ULTRASOUND ABDOMEN COMPLETE  COMPARISON:  Abdominal ultrasound and CT 10/04/2010.  FINDINGS: Gallbladder: No gallstones or wall thickening visualized. No sonographic Murphy sign noted.  Common bile duct: Diameter: 1.8 mm. No evidence of choledocholithiasis.  Liver: No focal lesion identified. Within normal limits in parenchymal echogenicity.  IVC: No abnormality visualized.  Pancreas: Visualized portion unremarkable.  Spleen: Size and appearance within normal limits.  Right Kidney: Length: 11.1 cm. Echogenicity within normal limits. No mass or hydronephrosis visualized.  Left Kidney: Length: 11.8 cm. Echogenicity within normal limits. No mass or  hydronephrosis visualized.  Abdominal aorta: No aneurysm visualized.  Other findings: None.  IMPRESSION: Normal abdominal ultrasound.   Electronically Signed   By: Camie Patience M.D.   On: 10/12/2014 11:15      Assessment: Epigastric abdominal pain nausea vomiting and some diarrhea, with strong family history of gallbladder disease.  Plan: Await HIDA scan to be done later today. If unrevealing tentatively plan EGD for tomorrow.    Natalie Holloway C 10/13/2014, 10:53 AM

## 2014-10-13 NOTE — Progress Notes (Signed)
UR completed 

## 2014-10-13 NOTE — Progress Notes (Signed)
Family Medicine Teaching Service Daily Progress Note Intern Pager: [phone]  Patient name: Natalie Holloway Medical record number: [id number] Date of birth: [birth year] Age: 19 y.o. Gender: female  Primary Care Provider: Karma Greaser, DO Consultants: Gastroenterology Code Status: Full  Assessment and Plan: 19 y.o. female presenting with RUQ abdominal pain, nausea, vomiting, and diarrhea. PMH is significant for HTN, which has since resolved (thought to be d/t OCPs), GERD. Note - significant reported family history of GB disease.  # RUQ Abdominal Pain / Nausea / Vomiting:  Recently seen at [facility] ER on 2/1 with WBC 13.7, Glucose elevated at 139, ketones in urine, lipase normal, and anion gap 20. CT Abd/Pelvis showed no findings for acute abdominal pain (no gallstones) but also showed a small 32m pulmonary nodule with recommendations to follow up in 12 months; no calculi or hydronephrosis noted.  - Vitals normal - Electrolytes normal.  - WBC 15> 7.3 - Urine pregnancy and hCG negative - Urinalysis negative - UDS positive for opioids and THC - C difficile negative - CXR with no acute cardiopulmonary disease - UKoreaabdomen normal (no gallstones or wall thickening, neg UKoreamurphy's) - Clear liquid diet as tolerated, NPO after MN - Bentyl 15mTID - Protonix 2053m Tylenol PRN fever/mild pain. Morphine 1q3hr PRN moderate to severe pain. - Zofran PRN nausea, Maalox/Mylanta PRN indigestion - GI consulted. Appreciate recommendations.  - HIDA Scan today- evaluate for acalculus biliary disease and gallbladder function   - If abnormal, contact surgery for possible cholecystectomy  - Consider endoscopy/colonoscopy   FEN/GI: Clear Liquid Diet >> NPO after MN, NS @ 100 (s/p 2L IVF in ER) Prophylaxis: SQ Heparin  Disposition: Discharge home pending improvement in abdominal pain and vomiting.  Subjective:  No acute complaints overnight. Notes improvement in pain and nausea. States HIDA scan is  postponed until this afternoon. No further concerns today.  Objective: Temp:  [99.2 F (37.3 C)-99.3 F (37.4 C)] 99.2 F (37.3 C) (02/04 0511) Pulse Rate:  [68-102] 87 (02/04 0511) Resp:  [14-20] 18 (02/04 0511) BP: (110-141)/(66-92) 119/74 mmHg (02/04 0511) SpO2:  [97 %-100 %] 98 % (02/04 0511) Weight:  [162 lb 11.2 oz (73.8 kg)] 162 lb 11.2 oz (73.8 kg) (02/03 1826) Physical Exam: General: [age]yomale resting comfortably in no apparent distress Cardiovascular: S1 and S2 noted. No murmurs/rubs/gallops. Regular rate and rhythm. Respiratory: Clear to ausculation bilaterally. No wheezes/rales/rhonchi. No increased work of breathing noted. Abdomen: Bowel sounds noted. Soft and nondistended. Tenderness in RUQ and epigastric area. Extremities: No edema noted  Laboratory:  Recent Labs Lab 10/12/14 0920 10/13/14 0719  WBC 15.0* 7.3  HGB 13.5 12.0  HCT 40.4 35.4*  PLT 338 280    Recent Labs Lab 10/12/14 1235 10/13/14 0719  NA 138 138  K 3.8 3.5  CL 107 107  CO2 24 22  BUN 8 6  CREATININE 0.79 0.75  CALCIUM 9.0 8.8  PROT 6.8  --   BILITOT 0.8  --   ALKPHOS 62  --   ALT 21  --   AST 19  --   GLUCOSE 102* 94   Urinalysis    Component Value Date/Time   COLORURINE YELLOW 10/12/2014 1017   APPEARANCEUR CLEAR 10/12/2014 1017   LABSPEC 1.016 10/12/2014 1017   PHURINE 7.5 10/12/2014 1017   GLUCOSEU NEGATIVE 10/12/2014 1017   HGBUR NEGATIVE 10/12/2014 1017   BILIRUBINUR NEGATIVE 10/12/2014 1017   KETONESUR >80* 10/12/2014 1017   PROTEINUR NEGATIVE 10/12/2014 1017   UROBILINOGEN 0.2 10/12/2014  [place] 10/12/2014 [place] 10/12/2014 1017  - hCG negative - pregnancy negative - UDS positive for opioids and THC  Imaging/Diagnostic Tests: Dg Chest 2 View  10/12/2014   CLINICAL DATA:  generalized abd pain and nausea for 3 weeks. Worse for the past few days. She was seen at [place] and told that it was her gallbladder.  EXAM: CHEST - 2  VIEW  COMPARISON:  08/21/2012  FINDINGS: Lungs are clear. Heart size and mediastinal contours are within normal limits. No effusion. Visualized skeletal structures are unremarkable.  IMPRESSION: No acute cardiopulmonary disease.   Electronically Signed   By: Arne Cleveland M.D.   On: 10/12/2014 12:21   US Abdomen Complete  10/12/2014   CLINICAL DATA:  Right upper quadrant abdominal pain for 3 weeks with nausea and vomiting. Initial encounter.  EXAM: ULTRASOUND ABDOMEN COMPLETE  COMPARISON:  Abdominal ultrasound and CT 10/04/2010.  FINDINGS: Gallbladder: No gallstones or wall thickening visualized. No sonographic Murphy sign noted.  Common bile duct: Diameter: 1.8 mm. No evidence of choledocholithiasis.  Liver: No focal lesion identified. Within normal limits in parenchymal echogenicity.  IVC: No abnormality visualized.  Pancreas: Visualized portion unremarkable.  Spleen: Size and appearance within normal limits.  Right Kidney: Length: 11.1 cm. Echogenicity within normal limits. No mass or hydronephrosis visualized.  Left Kidney: Length: 11.8 cm. Echogenicity within normal limits. No mass or hydronephrosis visualized.  Abdominal aorta: No aneurysm visualized.  Other findings: None.  IMPRESSION: Normal abdominal ultrasound.   Electronically Signed   By: Camie Patience M.D.   On: 10/12/2014 11:15   Lorna Few, DO 10/13/2014, 9:52 AM PGY-1, [place] Intern pager: [phone], text pages welcome

## 2014-10-14 ENCOUNTER — Observation Stay (HOSPITAL_COMMUNITY): Payer: BLUE CROSS/BLUE SHIELD

## 2014-10-14 ENCOUNTER — Encounter (HOSPITAL_COMMUNITY)
Admission: EM | Disposition: A | Discharge status: Home / Self Care | Payer: Self-pay | Source: Home / Self Care | Attending: Emergency Medicine

## 2014-10-14 ENCOUNTER — Encounter (HOSPITAL_COMMUNITY): Payer: Self-pay | Admitting: General Surgery

## 2014-10-14 DIAGNOSIS — R197 Diarrhea, unspecified: Secondary | ICD-10-CM | POA: Diagnosis not present

## 2014-10-14 DIAGNOSIS — R1010 Upper abdominal pain, unspecified: Secondary | ICD-10-CM | POA: Diagnosis not present

## 2014-10-14 DIAGNOSIS — R1011 Right upper quadrant pain: Secondary | ICD-10-CM | POA: Diagnosis not present

## 2014-10-14 SURGERY — EGD (ESOPHAGOGASTRODUODENOSCOPY)
Anesthesia: Monitor Anesthesia Care

## 2014-10-14 NOTE — Consult Note (Signed)
[facility] Surgery Consult Note  FREADA TWERSKY 1996/08/19  [id number].    Requesting MD: Dr. Amedeo Plenty Chief Complaint/Reason for Consult: Borderline low EF of gallbladder  HPI:  19 y.o. female admitted to the [facility] on 10/12/14 because of a three-week history of the above symptoms.The patient states that she's had stomach problems since childhood. She notes intermittent abdominal pain, bloating, nausea, and constipation/diarrhea.  She has taken heavy NSAIDS in the past for ovarian cyst, but none recently.  Denies alcohol or smoking.  Does not drink energy drinks, much caffeine (1 serving per day max), no caffeine supplements.  She was prescribed prilosec for GERD symptoms in HS, but never followed up with a GI doctor for an UGI as recommended.  3 weeks ago she c/o loose stools 5-10 nonbloody BM's/day. She has had upper abdominal pain (epigastric and RUQ) radiates to her back, crampy and colicky, but now continuous.  Pain comes on 15-20 minutes after eating and she gets significant early satiety.  She has been intermittently able to hold down solids and liquids.  No weight loss.  No jaundice, acholic stools, or dark urine.  During this period of time, she has been essentially unable to eat, and she has been having recurrent bilious/food vomiting.  No CP/SOB, fever/chills, rash, headache.    The patient was seen in an [facility] in [place] on 10/10/14, at which time a CT scan of the abdomen and pelvis were negative and she was discharged home.  Because of ongoing symptoms, she came to [facility] where an abdominal ultrasound was negative for stones, white count was moderately elevated at 15,000, but other labs were normal including LFT's and lipase. The patient points out that her maternal grandmother, her maternal great-grandmother, and several maternal aunts have a history of gallbladder trouble.  However, the patient's mother has not had any gallbladder issues.  Has a H/o HTN while on OCP's in the past, but no  longer takes meds.  No surgeries except for lip frenulum.  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  She says if she misses any more school she will be kicked out of her program since she has already used up all her leave of absences for this recent problem.   ROS: All systems reviewed and otherwise negative except for as above  No family history on file.  Past Medical History  Diagnosis Date  . Essential hypertension     History reviewed. No pertinent past surgical history.  Social History:  reports that she has never smoked. She does not have any smokeless tobacco history on file. She reports that she does not drink alcohol or use illicit drugs.  Allergies: No Known Allergies  Medications Prior to Admission  Medication Sig Dispense Refill  . dicyclomine (BENTYL) 10 MG capsule Take 10 mg by mouth 3 (three) times daily.  1  . famotidine (PEPCID) 20 MG tablet Take 20 mg by mouth 2 (two) times daily.    Marland Kitchen omeprazole (PRILOSEC) 10 MG capsule Take 10 mg by mouth daily.    . promethazine (PHENERGAN) 25 MG tablet Take 25 mg by mouth every 8 (eight) hours.  0  . metoprolol succinate (TOPROL XL) 25 MG 24 hr tablet Take 1 tablet (25 mg total) by mouth daily. (Patient not taking: Reported on 10/12/2014) 30 tablet 5    Blood pressure 123/84, pulse 93, temperature 98.1 F (36.7 C), temperature source Oral, resp. rate 12, height 5' 2"  (  1.575 m), weight 162 lb 11.2 oz (73.8 kg), last menstrual period 09/12/2014, SpO2 99 %. Physical Exam: General: pleasant, WD/WN white female who is laying in bed in NAD HEENT: head is normocephalic, atraumatic.  Sclera are noninjected.  PERRL.  Ears and nose without any masses or lesions.  Mouth is pink and moist Heart: regular, rate, and rhythm.  No obvious murmurs, gallops, or rubs noted.  Palpable pedal pulses bilaterally Lungs: CTAB, no wheezes, rhonchi, or rales noted.  Respiratory effort nonlabored Abd:  soft, ND, quite tender in epigastrium and RUQ, +Murphys sign, +BS, no masses, hernias, or organomegaly, no scars MS: all 4 extremities are symmetrical with no cyanosis, clubbing, or edema. Skin: warm and dry with no masses, lesions, or rashes Psych: A&Ox3 with an appropriate affect.   Results for orders placed or performed during the hospital encounter of 10/12/14 (from the past 48 hour(s))  CBC with Differential/Platelet     Status: Abnormal   Collection Time: 10/12/14  9:20 AM  Result Value Ref Range   WBC 15.0 (H) 4.0 - 10.5 K/uL   RBC 4.78 3.87 - 5.11 MIL/uL   Hemoglobin 13.5 12.0 - 15.0 g/dL   HCT 40.4 36.0 - 46.0 %   MCV 84.5 78.0 - 100.0 fL   MCH 28.2 26.0 - 34.0 pg   MCHC 33.4 30.0 - 36.0 g/dL   RDW 13.2 11.5 - 15.5 %   Platelets 338 150 - 400 K/uL   Neutrophils Relative % 87 (H) 43 - 77 %   Neutro Abs 12.9 (H) 1.7 - 7.7 K/uL   Lymphocytes Relative 7 (L) 12 - 46 %   Lymphs Abs 1.0 0.7 - 4.0 K/uL   Monocytes Relative 6 3 - 12 %   Monocytes Absolute 1.0 0.1 - 1.0 K/uL   Eosinophils Relative 0 0 - 5 %   Eosinophils Absolute 0.0 0.0 - 0.7 K/uL   Basophils Relative 0 0 - 1 %   Basophils Absolute 0.0 0.0 - 0.1 K/uL  Urinalysis, Routine w reflex microscopic     Status: Abnormal   Collection Time: 10/12/14 10:17 AM  Result Value Ref Range   Color, Urine YELLOW YELLOW   APPearance CLEAR CLEAR   Specific Gravity, Urine 1.016 1.005 - 1.030   pH 7.5 5.0 - 8.0   Glucose, UA NEGATIVE NEGATIVE mg/dL   Hgb urine dipstick NEGATIVE NEGATIVE   Bilirubin Urine NEGATIVE NEGATIVE   Ketones, ur >80 (A) NEGATIVE mg/dL    Comment: REPEATED TO VERIFY   Protein, ur NEGATIVE NEGATIVE mg/dL   Urobilinogen, UA 0.2 0.0 - 1.0 mg/dL   Nitrite NEGATIVE NEGATIVE   Leukocytes, UA NEGATIVE NEGATIVE    Comment: MICROSCOPIC NOT DONE ON URINES WITH NEGATIVE PROTEIN, BLOOD, LEUKOCYTES, NITRITE, OR GLUCOSE <1000 mg/dL.  Urine rapid drug screen (hosp performed)     Status: Abnormal   Collection Time:  10/12/14 10:17 AM  Result Value Ref Range   Opiates POSITIVE (A) NONE DETECTED   Cocaine NONE DETECTED NONE DETECTED   Benzodiazepines NONE DETECTED NONE DETECTED   Amphetamines NONE DETECTED NONE DETECTED   Tetrahydrocannabinol POSITIVE (A) NONE DETECTED   Barbiturates NONE DETECTED NONE DETECTED    Comment:        DRUG SCREEN FOR MEDICAL PURPOSES ONLY.  IF CONFIRMATION IS NEEDED FOR ANY PURPOSE, NOTIFY LAB WITHIN 5 DAYS.        LOWEST DETECTABLE LIMITS FOR URINE DRUG SCREEN Drug Class       Cutoff (ng/mL) Amphetamine  1000 Barbiturate      200 Benzodiazepine   194 Tricyclics       174 Opiates          300 Cocaine          300 THC              50   POC urine preg, ED (not at [facility])     Status: None   Collection Time: 10/12/14 10:44 AM  Result Value Ref Range   Preg Test, Ur NEGATIVE NEGATIVE    Comment:        THE SENSITIVITY OF THIS METHODOLOGY IS >24 mIU/mL   Comprehensive metabolic panel     Status: Abnormal   Collection Time: 10/12/14 12:35 PM  Result Value Ref Range   Sodium 138 135 - 145 mmol/L   Potassium 3.8 3.5 - 5.1 mmol/L   Chloride 107 96 - 112 mmol/L   CO2 24 19 - 32 mmol/L   Glucose, Bld 102 (H) 70 - 99 mg/dL   BUN 8 6 - 23 mg/dL   Creatinine, Ser 0.79 0.50 - 1.10 mg/dL   Calcium 9.0 8.4 - 10.5 mg/dL   Total Protein 6.8 6.0 - 8.3 g/dL   Albumin 4.1 3.5 - 5.2 g/dL   AST 19 0 - 37 U/L   ALT 21 0 - 35 U/L   Alkaline Phosphatase 62 39 - 117 U/L   Total Bilirubin 0.8 0.3 - 1.2 mg/dL   GFR calc non Af Amer >90 >90 mL/min   GFR calc Af Amer >90 >90 mL/min    Comment: (NOTE) The eGFR has been calculated using the CKD EPI equation. This calculation has not been validated in all clinical situations. eGFR's persistently <90 mL/min signify possible Chronic Kidney Disease.    Anion gap 7 5 - 15  Lipase, blood     Status: None   Collection Time: 10/12/14 12:35 PM  Result Value Ref Range   Lipase 32 11 - 59 U/L  hCG, quantitative, pregnancy     Status:  None   Collection Time: 10/12/14  1:00 PM  Result Value Ref Range   hCG, Beta Chain, Quant, S <1 <5 mIU/mL    Comment:          GEST. AGE      CONC.  (mIU/mL)   <=1 WEEK        5 - 50     2 WEEKS       50 - 500     3 WEEKS       100 - 10,000     4 WEEKS     1,000 - 30,000     5 WEEKS     3,500 - 115,000   6-8 WEEKS     12,000 - 270,000    12 WEEKS     15,000 - 220,000        FEMALE AND NON-PREGNANT FEMALE:     LESS THAN 5 mIU/mL   Clostridium Difficile by PCR     Status: None   Collection Time: 10/13/14  4:47 AM  Result Value Ref Range   C difficile by pcr NEGATIVE NEGATIVE  Basic metabolic panel     Status: None   Collection Time: 10/13/14  7:19 AM  Result Value Ref Range   Sodium 138 135 - 145 mmol/L   Potassium 3.5 3.5 - 5.1 mmol/L   Chloride 107 96 - 112 mmol/L   CO2 22 19 - 32 mmol/L  Glucose, Bld 94 70 - 99 mg/dL   BUN 6 6 - 23 mg/dL   Creatinine, Ser 0.75 0.50 - 1.10 mg/dL   Calcium 8.8 8.4 - 10.5 mg/dL   GFR calc non Af Amer >90 >90 mL/min   GFR calc Af Amer >90 >90 mL/min    Comment: (NOTE) The eGFR has been calculated using the CKD EPI equation. This calculation has not been validated in all clinical situations. eGFR's persistently <90 mL/min signify possible Chronic Kidney Disease.    Anion gap 9 5 - 15  CBC     Status: Abnormal   Collection Time: 10/13/14  7:19 AM  Result Value Ref Range   WBC 7.3 4.0 - 10.5 K/uL   RBC 4.30 3.87 - 5.11 MIL/uL   Hemoglobin 12.0 12.0 - 15.0 g/dL   HCT 35.4 (L) 36.0 - 46.0 %   MCV 82.3 78.0 - 100.0 fL   MCH 27.9 26.0 - 34.0 pg   MCHC 33.9 30.0 - 36.0 g/dL   RDW 12.9 11.5 - 15.5 %   Platelets 280 150 - 400 K/uL   Dg Chest 2 View  10/12/2014   CLINICAL DATA:  generalized abd pain and nausea for 3 weeks. Worse for the past few days. She was seen at [place] and told that it was her gallbladder.  EXAM: CHEST - 2 VIEW  COMPARISON:  08/21/2012  FINDINGS: Lungs are clear. Heart size and mediastinal contours are within normal  limits. No effusion. Visualized skeletal structures are unremarkable.  IMPRESSION: No acute cardiopulmonary disease.   Electronically Signed   By: Arne Cleveland M.D.   On: 10/12/2014 12:21   Nm Hepatobiliary Liver Func  10/13/2014   CLINICAL DATA:  Abdominal pain and nausea. Right upper quadrant pain. Pain normal abdominal ultrasound. Pain for 3 weeks  EXAM: NUCLEAR MEDICINE HEPATOBILIARY IMAGING WITH GALLBLADDER EF  TECHNIQUE: Sequential images of the abdomen were obtained out to 60 minutes following intravenous administration of radiopharmaceutical. After slow intravenous infusion of 1.5 micrograms Cholecystokinin, gallbladder ejection fraction was determined.  RADIOPHARMACEUTICALS:  5.0 Millicurie [id number]-87O Choletec  COMPARISON:  Ultrasound 10/12/2014  FINDINGS: There is homogeneous radiotracer uptake within the liver. The gallbladder is evident by 50 min. Counts are present within the small bowel by 20 min. Administration of CCK resulted in minimal contraction of the gallbladder. Gallbladder ejection fraction equals 31 %. At 45 min, normal ejection fraction is greater than 33 %.  IMPRESSION: 1. Patent cystic duct and common bile duct. 2. No evidence of cholecystitis. 3. Borderline Low gallbladder ejection fraction = 31%. Common differential diagnosis includes biliary dyskinesia, chronic cholecystitis, and sphincter Oddi dysfunction.   Electronically Signed   By: Suzy Bouchard M.D.   On: 10/13/2014 17:35   US Abdomen Complete  10/12/2014   CLINICAL DATA:  Right upper quadrant abdominal pain for 3 weeks with nausea and vomiting. Initial encounter.  EXAM: ULTRASOUND ABDOMEN COMPLETE  COMPARISON:  Abdominal ultrasound and CT 10/04/2010.  FINDINGS: Gallbladder: No gallstones or wall thickening visualized. No sonographic Murphy sign noted.  Common bile duct: Diameter: 1.8 mm. No evidence of choledocholithiasis.  Liver: No focal lesion identified. Within normal limits in parenchymal echogenicity.  IVC: No  abnormality visualized.  Pancreas: Visualized portion unremarkable.  Spleen: Size and appearance within normal limits.  Right Kidney: Length: 11.1 cm. Echogenicity within normal limits. No mass or hydronephrosis visualized.  Left Kidney: Length: 11.8 cm. Echogenicity within normal limits. No mass or hydronephrosis visualized.  Abdominal aorta: No aneurysm visualized.  Other  findings: None.  IMPRESSION: Normal abdominal ultrasound.   Electronically Signed   By: Camie Patience M.D.   On: 10/12/2014 11:15      Assessment/Plan Acute on chronic abdominal pain Nausea/vomiting/diarrhea Normal [place] HIDA, no evidence of cholecystitis, borderline low EF at 31%  Plan: 1.  GI potentially doing UGI today.  Her HIDA is not definitive for biliary dyskinesia or chronic cholecystitis, and she has no acute cholecystitis.  LFT's are normal.  We will attempt to try to get her on the OR schedule, but due to emergencies may be bumped.  She has trouble with her college program and if she misses any more school she may be kicked out of the program.  Talked with Dr. Amedeo Plenty he may be able to do her UGI early then if possible we could do her lap chole late today or possibly tomorrow depending on availability of OR. 2.  NPO,  IVF, pain control, antiemetics 3.  SCD's  4.  Ambulate and IS   Coralie Keens, [facility] Surgery 10/14/2014, 8:55 AM Pager: [phone]  Addendum:  Discussed with Dr. Rosendo Gros, we are not able to get her scheduled in OR today, will have to see if its possible for tomorrow.  Otherwise she will need to have the procedure done as an outpatient.  Allow sips of liquids if desired, but NPO after MN.

## 2014-10-14 NOTE — Progress Notes (Signed)
Pt's HIDA scan showed slightly low EF, otherwise nml. Due to nausea, vomiting and diarrhea, was scheduled for EGD this AM. However, she requests surgical consult prior to any further workup, at least partly due to strong family history of gall bladder disease. Consult called

## 2014-10-14 NOTE — Progress Notes (Signed)
[facility] Progress Note  Subjective: Patient does still complaining of mainly epigastric abdominal pain as well as nausea and diarrhea  Objective: Vital signs in last 24 hours: Temp:  [98.1 F (36.7 C)-99.2 F (37.3 C)] 98.1 F (36.7 C) (02/05 0628) Pulse Rate:  [85-93] 93 (02/05 0628) Resp:  [12-20] 12 (02/05 0628) BP: (123-141)/(82-95) 123/84 mmHg (02/05 0628) SpO2:  [99 %-100 %] 99 % (02/05 0628) Weight change:    PE: Unchanged  Lab Results: No results found for this or any previous visit (from the past 24 hour(s)).  Studies/Results: Dg Chest 2 View  10/12/2014   CLINICAL DATA:  generalized abd pain and nausea for 3 weeks. Worse for the past few days. She was seen at [place] and told that it was her gallbladder.  EXAM: CHEST - 2 VIEW  COMPARISON:  08/21/2012  FINDINGS: Lungs are clear. Heart size and mediastinal contours are within normal limits. No effusion. Visualized skeletal structures are unremarkable.  IMPRESSION: No acute cardiopulmonary disease.   Electronically Signed   By: Arne Cleveland M.D.   On: 10/12/2014 12:21   Nm Hepatobiliary Liver Func  10/13/2014   CLINICAL DATA:  Abdominal pain and nausea. Right upper quadrant pain. Pain normal abdominal ultrasound. Pain for 3 weeks  EXAM: NUCLEAR MEDICINE HEPATOBILIARY IMAGING WITH GALLBLADDER EF  TECHNIQUE: Sequential images of the abdomen were obtained out to 60 minutes following intravenous administration of radiopharmaceutical. After slow intravenous infusion of 1.5 micrograms Cholecystokinin, gallbladder ejection fraction was determined.  RADIOPHARMACEUTICALS:  5.0 Millicurie PP-50D Choletec  COMPARISON:  Ultrasound 10/12/2014  FINDINGS: There is homogeneous radiotracer uptake within the liver. The gallbladder is evident by 50 min. Counts are present within the small bowel by 20 min. Administration of CCK resulted in minimal contraction of the gallbladder. Gallbladder ejection fraction equals 31 %. At 45 min,  normal ejection fraction is greater than 33 %.  IMPRESSION: 1. Patent cystic duct and common bile duct. 2. No evidence of cholecystitis. 3. Borderline Low gallbladder ejection fraction = 31%. Common differential diagnosis includes biliary dyskinesia, chronic cholecystitis, and sphincter Oddi dysfunction.   Electronically Signed   By: Suzy Bouchard M.D.   On: 10/13/2014 17:35   US Abdomen Complete  10/12/2014   CLINICAL DATA:  Right upper quadrant abdominal pain for 3 weeks with nausea and vomiting. Initial encounter.  EXAM: ULTRASOUND ABDOMEN COMPLETE  COMPARISON:  Abdominal ultrasound and CT 10/04/2010.  FINDINGS: Gallbladder: No gallstones or wall thickening visualized. No sonographic Murphy sign noted.  Common bile duct: Diameter: 1.8 mm. No evidence of choledocholithiasis.  Liver: No focal lesion identified. Within normal limits in parenchymal echogenicity.  IVC: No abnormality visualized.  Pancreas: Visualized portion unremarkable.  Spleen: Size and appearance within normal limits.  Right Kidney: Length: 11.1 cm. Echogenicity within normal limits. No mass or hydronephrosis visualized.  Left Kidney: Length: 11.8 cm. Echogenicity within normal limits. No mass or hydronephrosis visualized.  Abdominal aorta: No aneurysm visualized.  Other findings: None.  IMPRESSION: Normal abdominal ultrasound.   Electronically Signed   By: Camie Patience M.D.   On: 10/12/2014 11:15      Assessment: Nausea vomiting abdominal pain and diarrhea with slightly decreased ejection fraction on hida scan, strong family history of gallbladder disease,  ultrasound negative  Plan: Discussed several options with family. Surgery is seen patient and tentatively scheduled for cholecystectomy but unclear whether they can do it this afternoon. Will obtain upper GI with small bowel series to better rule out any acid peptic disease  or inflammatory bowel disease of the small intestine and try to get this accomplished  today.    Natalie Holloway C 10/14/2014, 10:38 AM

## 2014-10-14 NOTE — Progress Notes (Signed)
Family Medicine Teaching Service Daily Progress Note Intern Pager: [phone]  Patient name: Natalie Holloway Medical record number: [id number] Date of birth: 01/01/1996 Age: 19 y.o. Gender: female  Primary Care Provider: Karma Greaser, DO Consultants: Gastroenterology Code Status: Full  Assessment and Plan: 19 y.o. female presenting with RUQ abdominal pain, nausea, vomiting, and diarrhea. PMH is significant for HTN, which has since resolved (thought to be d/t OCPs), GERD. Note - significant reported family history of GB disease.  # RUQ Abdominal Pain / Nausea / Vomiting:  Recently seen at [facility] ER on 2/1 with WBC 13.7, Glucose elevated at 139, ketones in urine, lipase normal, and anion gap 20. CT Abd/Pelvis showed no findings for acute abdominal pain (no gallstones) but also showed a small 18m pulmonary nodule with recommendations to follow up in 12 months; no calculi or hydronephrosis noted.  - Vitals normal - Electrolytes normal.  - WBC 15> 7.3 - Urine pregnancy and hCG negative - Urinalysis negative - UDS positive for opioids and THC - C difficile negative - CXR with no acute cardiopulmonary disease - UKoreaabdomen normal (no gallstones or wall thickening, neg UKoreamurphy's) - Clear liquid diet as tolerated, NPO after MN - Bentyl 141mTID - Protonix 2033m Tylenol PRN fever/mild pain. Morphine 1q3hr PRN moderate to severe pain. - Zofran PRN nausea, Maalox/Mylanta PRN indigestion - GI consulted. Appreciate recommendations.  - HIDA Scan- borderline low EF 31%   - If abnormal, contact surgery for possible cholecystectomy  - Consider endoscopy/colonoscopy    - Upper GI with small bowel series to rule out acid peptic disease or IBD - General surgery consulted. Appreciate recommendations.  - May go for cholecystectomy today vs. 2/6  FEN/GI: Clear Liquid Diet >> NPO after MN, NS @ 100 (s/p 2L IVF in ER) Prophylaxis: SCDs  Disposition: Discharge home pending improvement in abdominal pain  and vomiting.  Subjective:  No acute complaints overnight. Notes improvement in pain with morphine. Would like to have surgery done as inpatient to avoid missing further days in school. States she cannot miss any more days or she will be kicked out of program. No further concerns today.  Objective: Temp:  [98.1 F (36.7 C)-99.2 F (37.3 C)] 98.1 F (36.7 C) (02/05 0628) Pulse Rate:  [85-93] 93 (02/05 0628) Resp:  [12-20] 12 (02/05 0628) BP: (123-141)/(82-95) 123/84 mmHg (02/05 0628) SpO2:  [99 %-100 %] 99 % (02/05 0624270hysical Exam: General: [age]yomale resting comfortably in no apparent distress Cardiovascular: S1 and S2 noted. No murmurs/rubs/gallops. Regular rate and rhythm. Respiratory: Clear to ausculation bilaterally. No wheezes/rales/rhonchi. No increased work of breathing noted. Abdomen: Bowel sounds noted. Soft and nondistended. Tenderness in RUQ and epigastric area. Extremities: No edema noted  Laboratory:  Recent Labs Lab 10/12/14 0920 10/13/14 0719  WBC 15.0* 7.3  HGB 13.5 12.0  HCT 40.4 35.4*  PLT 338 280    Recent Labs Lab 10/12/14 1235 10/13/14 0719  NA 138 138  K 3.8 3.5  CL 107 107  CO2 24 22  BUN 8 6  CREATININE 0.79 0.75  CALCIUM 9.0 8.8  PROT 6.8  --   BILITOT 0.8  --   ALKPHOS 62  --   ALT 21  --   AST 19  --   GLUCOSE 102* 94   Urinalysis    Component Value Date/Time   COLORURINE YELLOW 10/12/2014 1017   APPEARANCEUR CLEAR 10/12/2014 1017   LABSPEC 1.016 10/12/2014 1017   PHURINE 7.5 10/12/2014 1017   GLUCOSEU  NEGATIVE 10/12/2014 1017   HGBUR NEGATIVE 10/12/2014 1017   BILIRUBINUR NEGATIVE 10/12/2014 1017   KETONESUR >80* 10/12/2014 1017   PROTEINUR NEGATIVE 10/12/2014 1017   UROBILINOGEN 0.2 10/12/2014 1017   NITRITE NEGATIVE 10/12/2014 1017   LEUKOCYTESUR NEGATIVE 10/12/2014 1017  - hCG negative - pregnancy negative - UDS positive for opioids and THC  Imaging/Diagnostic Tests: Dg Chest 2 View  10/12/2014   CLINICAL DATA:   generalized abd pain and nausea for 3 weeks. Worse for the past few days. She was seen at [place] and told that it was her gallbladder.  EXAM: CHEST - 2 VIEW  COMPARISON:  08/21/2012  FINDINGS: Lungs are clear. Heart size and mediastinal contours are within normal limits. No effusion. Visualized skeletal structures are unremarkable.  IMPRESSION: No acute cardiopulmonary disease.   Electronically Signed   By: Arne Cleveland M.D.   On: 10/12/2014 12:21   Nm Hepatobiliary Liver Func  10/13/2014   CLINICAL DATA:  Abdominal pain and nausea. Right upper quadrant pain. Pain normal abdominal ultrasound. Pain for 3 weeks  EXAM: NUCLEAR MEDICINE HEPATOBILIARY IMAGING WITH GALLBLADDER EF  TECHNIQUE: Sequential images of the abdomen were obtained out to 60 minutes following intravenous administration of radiopharmaceutical. After slow intravenous infusion of 1.5 micrograms Cholecystokinin, gallbladder ejection fraction was determined.  RADIOPHARMACEUTICALS:  5.0 Millicurie [id number] Choletec  COMPARISON:  Ultrasound 10/12/2014  FINDINGS: There is homogeneous radiotracer uptake within the liver. The gallbladder is evident by 50 min. Counts are present within the small bowel by 20 min. Administration of CCK resulted in minimal contraction of the gallbladder. Gallbladder ejection fraction equals 31 %. At 45 min, normal ejection fraction is greater than 33 %.  IMPRESSION: 1. Patent cystic duct and common bile duct. 2. No evidence of cholecystitis. 3. Borderline Low gallbladder ejection fraction = 31%. Common differential diagnosis includes biliary dyskinesia, chronic cholecystitis, and sphincter Oddi dysfunction.   Electronically Signed   By: Suzy Bouchard M.D.   On: 10/13/2014 17:35   US Abdomen Complete  10/12/2014   CLINICAL DATA:  Right upper quadrant abdominal pain for 3 weeks with nausea and vomiting. Initial encounter.  EXAM: ULTRASOUND ABDOMEN COMPLETE  COMPARISON:  Abdominal ultrasound and CT 10/04/2010.  FINDINGS:  Gallbladder: No gallstones or wall thickening visualized. No sonographic Murphy sign noted.  Common bile duct: Diameter: 1.8 mm. No evidence of choledocholithiasis.  Liver: No focal lesion identified. Within normal limits in parenchymal echogenicity.  IVC: No abnormality visualized.  Pancreas: Visualized portion unremarkable.  Spleen: Size and appearance within normal limits.  Right Kidney: Length: 11.1 cm. Echogenicity within normal limits. No mass or hydronephrosis visualized.  Left Kidney: Length: 11.8 cm. Echogenicity within normal limits. No mass or hydronephrosis visualized.  Abdominal aorta: No aneurysm visualized.  Other findings: None.  IMPRESSION: Normal abdominal ultrasound.   Electronically Signed   By: Camie Patience M.D.   On: 10/12/2014 11:15   Lorna Few, DO 10/14/2014, 8:16 AM PGY-1, [place] Intern pager: [phone], text pages welcome

## 2014-10-14 NOTE — Progress Notes (Signed)
UR completed 

## 2014-10-15 ENCOUNTER — Observation Stay (HOSPITAL_COMMUNITY): Payer: BLUE CROSS/BLUE SHIELD | Admitting: Anesthesiology

## 2014-10-15 ENCOUNTER — Encounter (HOSPITAL_COMMUNITY)
Admission: EM | Disposition: A | Discharge status: Home / Self Care | Payer: Self-pay | Source: Home / Self Care | Attending: Emergency Medicine

## 2014-10-15 DIAGNOSIS — R1010 Upper abdominal pain, unspecified: Secondary | ICD-10-CM | POA: Diagnosis not present

## 2014-10-15 DIAGNOSIS — R1011 Right upper quadrant pain: Secondary | ICD-10-CM | POA: Diagnosis not present

## 2014-10-15 DIAGNOSIS — R197 Diarrhea, unspecified: Secondary | ICD-10-CM | POA: Diagnosis not present

## 2014-10-15 HISTORY — PX: CHOLECYSTECTOMY: SHX55

## 2014-10-15 LAB — SURGICAL PCR SCREEN
MRSA, PCR: NEGATIVE
Staphylococcus aureus: POSITIVE — AB

## 2014-10-15 LAB — GLUCOSE, CAPILLARY: Glucose-Capillary: 88 mg/dL (ref 70–99)

## 2014-10-15 SURGERY — LAPAROSCOPIC CHOLECYSTECTOMY
Anesthesia: General | Site: Abdomen

## 2014-10-15 MED ORDER — SCOPOLAMINE 1 MG/3DAYS TD PT72
MEDICATED_PATCH | TRANSDERMAL | Status: AC
  Administered 2014-10-15: 1 via TRANSDERMAL
  Filled 2014-10-15: qty 1

## 2014-10-15 MED ORDER — SODIUM CHLORIDE 0.9 % IV SOLN
INTRAVENOUS | Status: DC
  Administered 2014-10-15: 11:00:00 via INTRAVENOUS

## 2014-10-15 MED ORDER — KETOROLAC TROMETHAMINE 30 MG/ML IJ SOLN
INTRAMUSCULAR | Status: DC | PRN
  Administered 2014-10-15: 30 mg via INTRAVENOUS

## 2014-10-15 MED ORDER — SUCCINYLCHOLINE CHLORIDE 20 MG/ML IJ SOLN
INTRAMUSCULAR | Status: DC | PRN
  Administered 2014-10-15: 100 mg via INTRAVENOUS

## 2014-10-15 MED ORDER — PROMETHAZINE HCL 25 MG PO TABS: 25.0000 mg | ORAL_TABLET | Freq: Four times a day (QID) | ORAL | Status: DC | PRN

## 2014-10-15 MED ORDER — PROMETHAZINE HCL 25 MG/ML IJ SOLN
12.5000 mg | Freq: Four times a day (QID) | INTRAMUSCULAR | Status: DC | PRN
  Administered 2014-10-15 (×2): 12.5 mg via INTRAVENOUS
  Filled 2014-10-15 (×2): qty 1

## 2014-10-15 MED ORDER — VECURONIUM BROMIDE 10 MG IV SOLR
INTRAVENOUS | Status: AC
  Filled 2014-10-15: qty 10

## 2014-10-15 MED ORDER — HYDROMORPHONE HCL 1 MG/ML IJ SOLN
INTRAMUSCULAR | Status: AC
  Filled 2014-10-15: qty 1

## 2014-10-15 MED ORDER — ONDANSETRON HCL 4 MG/2ML IJ SOLN
INTRAMUSCULAR | Status: AC
  Filled 2014-10-15: qty 2

## 2014-10-15 MED ORDER — DICYCLOMINE HCL 10 MG PO CAPS
10.0000 mg | ORAL_CAPSULE | Freq: Three times a day (TID) | ORAL | Status: DC | PRN
  Filled 2014-10-15: qty 1

## 2014-10-15 MED ORDER — OXYCODONE HCL 5 MG PO TABS: 5.0000 mg | ORAL_TABLET | Freq: Once | ORAL | Status: DC | PRN

## 2014-10-15 MED ORDER — MORPHINE SULFATE 2 MG/ML IJ SOLN
2.0000 mg | INTRAMUSCULAR | Status: DC | PRN
  Administered 2014-10-15: 2 mg via INTRAVENOUS
  Filled 2014-10-15: qty 1

## 2014-10-15 MED ORDER — PROMETHAZINE HCL 25 MG RE SUPP: 25.0000 mg | Freq: Four times a day (QID) | RECTAL | Status: DC | PRN

## 2014-10-15 MED ORDER — LIDOCAINE HCL (CARDIAC) 20 MG/ML IV SOLN
INTRAVENOUS | Status: AC
  Filled 2014-10-15: qty 5

## 2014-10-15 MED ORDER — BUPIVACAINE-EPINEPHRINE 0.25% -1:200000 IJ SOLN
INTRAMUSCULAR | Status: DC | PRN
  Administered 2014-10-15: 20 mL

## 2014-10-15 MED ORDER — GLYCOPYRROLATE 0.2 MG/ML IJ SOLN
INTRAMUSCULAR | Status: AC
  Filled 2014-10-15: qty 2

## 2014-10-15 MED ORDER — CHLORHEXIDINE GLUCONATE CLOTH 2 % EX PADS: 6.0000 | MEDICATED_PAD | Freq: Every day | CUTANEOUS | Status: DC

## 2014-10-15 MED ORDER — GLYCOPYRROLATE 0.2 MG/ML IJ SOLN
INTRAMUSCULAR | Status: DC | PRN
  Administered 2014-10-15: 0.4 mg via INTRAVENOUS

## 2014-10-15 MED ORDER — HYDROMORPHONE HCL 1 MG/ML IJ SOLN
0.2500 mg | INTRAMUSCULAR | Status: DC | PRN
  Administered 2014-10-15: 0.5 mg via INTRAVENOUS

## 2014-10-15 MED ORDER — PROPOFOL 10 MG/ML IV BOLUS
INTRAVENOUS | Status: DC | PRN
  Administered 2014-10-15: 200 mg via INTRAVENOUS

## 2014-10-15 MED ORDER — OXYCODONE-ACETAMINOPHEN 5-325 MG PO TABS
1.0000 | ORAL_TABLET | Freq: Once | ORAL | Status: AC
  Administered 2014-10-15: 1 via ORAL
  Filled 2014-10-15: qty 1

## 2014-10-15 MED ORDER — SODIUM CHLORIDE 0.9 % IR SOLN
Status: DC | PRN
  Administered 2014-10-15: 1000 mL

## 2014-10-15 MED ORDER — MUPIROCIN 2 % EX OINT
1.0000 "application " | TOPICAL_OINTMENT | Freq: Two times a day (BID) | CUTANEOUS | Status: DC
  Administered 2014-10-15: 1 via NASAL
  Filled 2014-10-15 (×2): qty 22

## 2014-10-15 MED ORDER — ONDANSETRON HCL 4 MG/2ML IJ SOLN
4.0000 mg | Freq: Once | INTRAMUSCULAR | Status: AC
  Administered 2014-10-15: 4 mg via INTRAVENOUS
  Filled 2014-10-15: qty 2

## 2014-10-15 MED ORDER — DEXAMETHASONE SODIUM PHOSPHATE 4 MG/ML IJ SOLN
INTRAMUSCULAR | Status: DC | PRN
  Administered 2014-10-15: 4 mg via INTRAVENOUS

## 2014-10-15 MED ORDER — SUCCINYLCHOLINE CHLORIDE 20 MG/ML IJ SOLN
INTRAMUSCULAR | Status: AC
  Filled 2014-10-15: qty 1

## 2014-10-15 MED ORDER — LACTATED RINGERS IV SOLN
INTRAVENOUS | Status: DC | PRN
  Administered 2014-10-15 (×2): via INTRAVENOUS

## 2014-10-15 MED ORDER — SODIUM CHLORIDE 0.9 % IJ SOLN
INTRAMUSCULAR | Status: AC
  Filled 2014-10-15: qty 10

## 2014-10-15 MED ORDER — FENTANYL CITRATE 0.05 MG/ML IJ SOLN
INTRAMUSCULAR | Status: AC
  Filled 2014-10-15: qty 5

## 2014-10-15 MED ORDER — CEFAZOLIN SODIUM-DEXTROSE 2-3 GM-% IV SOLR
INTRAVENOUS | Status: AC
  Filled 2014-10-15: qty 50

## 2014-10-15 MED ORDER — ONDANSETRON 8 MG/NS 50 ML IVPB
8.0000 mg | Freq: Four times a day (QID) | INTRAVENOUS | Status: DC | PRN
  Filled 2014-10-15: qty 8

## 2014-10-15 MED ORDER — SCOPOLAMINE 1 MG/3DAYS TD PT72
1.0000 | MEDICATED_PATCH | TRANSDERMAL | Status: DC
  Administered 2014-10-15: 1.5 mg via TRANSDERMAL

## 2014-10-15 MED ORDER — NEOSTIGMINE METHYLSULFATE 10 MG/10ML IV SOLN
INTRAVENOUS | Status: AC
  Filled 2014-10-15: qty 1

## 2014-10-15 MED ORDER — BUPIVACAINE-EPINEPHRINE (PF) 0.25% -1:200000 IJ SOLN
INTRAMUSCULAR | Status: AC
  Filled 2014-10-15: qty 30

## 2014-10-15 MED ORDER — KETOROLAC TROMETHAMINE 30 MG/ML IJ SOLN
INTRAMUSCULAR | Status: AC
  Filled 2014-10-15: qty 1

## 2014-10-15 MED ORDER — ONDANSETRON HCL 4 MG PO TABS: 8.0000 mg | ORAL_TABLET | Freq: Four times a day (QID) | ORAL | Status: DC | PRN

## 2014-10-15 MED ORDER — NEOSTIGMINE METHYLSULFATE 10 MG/10ML IV SOLN
INTRAVENOUS | Status: DC | PRN
  Administered 2014-10-15: 2 mg via INTRAVENOUS

## 2014-10-15 MED ORDER — OXYCODONE HCL 5 MG/5ML PO SOLN: 5.0000 mg | Freq: Once | ORAL | Status: DC | PRN

## 2014-10-15 MED ORDER — MIDAZOLAM HCL 2 MG/2ML IJ SOLN
INTRAMUSCULAR | Status: DC | PRN
  Administered 2014-10-15: 2 mg via INTRAVENOUS

## 2014-10-15 MED ORDER — ONDANSETRON HCL 4 MG/2ML IJ SOLN
INTRAMUSCULAR | Status: DC | PRN
  Administered 2014-10-15: 4 mg via INTRAVENOUS

## 2014-10-15 MED ORDER — MIDAZOLAM HCL 2 MG/2ML IJ SOLN
INTRAMUSCULAR | Status: AC
  Filled 2014-10-15: qty 2

## 2014-10-15 MED ORDER — LIDOCAINE HCL (CARDIAC) 20 MG/ML IV SOLN
INTRAVENOUS | Status: DC | PRN
  Administered 2014-10-15: 60 mg via INTRAVENOUS

## 2014-10-15 MED ORDER — DEXAMETHASONE SODIUM PHOSPHATE 4 MG/ML IJ SOLN
INTRAMUSCULAR | Status: AC
  Filled 2014-10-15: qty 1

## 2014-10-15 MED ORDER — MORPHINE SULFATE 2 MG/ML IJ SOLN
1.0000 mg | INTRAMUSCULAR | Status: DC | PRN
Start: 2014-10-15 — End: 2014-10-15
  Administered 2014-10-15 (×2): 2 mg via INTRAVENOUS
  Filled 2014-10-15 (×3): qty 1

## 2014-10-15 MED ORDER — OXYCODONE-ACETAMINOPHEN 5-325 MG PO TABS: 1.0000 | ORAL_TABLET | ORAL | Status: DC | PRN

## 2014-10-15 MED ORDER — MUPIROCIN 2 % EX OINT: 1.0000 "application " | TOPICAL_OINTMENT | Freq: Two times a day (BID) | CUTANEOUS | Status: DC

## 2014-10-15 MED ORDER — ONDANSETRON HCL 4 MG/2ML IJ SOLN: 4.0000 mg | Freq: Once | INTRAMUSCULAR | Status: DC | PRN

## 2014-10-15 MED ORDER — CEFAZOLIN SODIUM-DEXTROSE 2-3 GM-% IV SOLR
INTRAVENOUS | Status: DC | PRN
  Administered 2014-10-15: 2 g via INTRAVENOUS

## 2014-10-15 MED ORDER — VECURONIUM BROMIDE 10 MG IV SOLR
INTRAVENOUS | Status: DC | PRN
  Administered 2014-10-15: 2 mg via INTRAVENOUS

## 2014-10-15 MED ORDER — FENTANYL CITRATE 0.05 MG/ML IJ SOLN
INTRAMUSCULAR | Status: DC | PRN
  Administered 2014-10-15: 125 ug via INTRAVENOUS
  Administered 2014-10-15: 50 ug via INTRAVENOUS
  Administered 2014-10-15: 125 ug via INTRAVENOUS
  Administered 2014-10-15: 150 ug via INTRAVENOUS
  Administered 2014-10-15: 50 ug via INTRAVENOUS

## 2014-10-15 MED ORDER — PROPOFOL 10 MG/ML IV BOLUS
INTRAVENOUS | Status: AC
  Filled 2014-10-15: qty 20

## 2014-10-15 SURGICAL SUPPLY — 36 items
APPLIER CLIP 5 13 M/L LIGAMAX5 (MISCELLANEOUS) ×3
CANISTER SUCTION 2500CC (MISCELLANEOUS) ×3 IMPLANT
CHLORAPREP W/TINT 26ML (MISCELLANEOUS) ×3 IMPLANT
CLIP APPLIE 5 13 M/L LIGAMAX5 (MISCELLANEOUS) ×1 IMPLANT
COVER MAYO STAND STRL (DRAPES) IMPLANT
COVER SURGICAL LIGHT HANDLE (MISCELLANEOUS) ×3 IMPLANT
DECANTER SPIKE VIAL GLASS SM (MISCELLANEOUS) ×3 IMPLANT
DRAPE C-ARM 42X72 X-RAY (DRAPES) IMPLANT
DRAPE LAPAROSCOPIC ABDOMINAL (DRAPES) ×3 IMPLANT
ELECT REM PT RETURN 9FT ADLT (ELECTROSURGICAL) ×3
ELECTRODE REM PT RTRN 9FT ADLT (ELECTROSURGICAL) ×1 IMPLANT
GLOVE SURG SIGNA 7.5 PF LTX (GLOVE) ×3 IMPLANT
GOWN STRL REUS W/ TWL LRG LVL3 (GOWN DISPOSABLE) ×3 IMPLANT
GOWN STRL REUS W/ TWL XL LVL3 (GOWN DISPOSABLE) ×1 IMPLANT
GOWN STRL REUS W/TWL LRG LVL3 (GOWN DISPOSABLE) ×6
GOWN STRL REUS W/TWL XL LVL3 (GOWN DISPOSABLE) ×2
KIT BASIN OR (CUSTOM PROCEDURE TRAY) ×3 IMPLANT
KIT ROOM TURNOVER OR (KITS) ×3 IMPLANT
LIQUID BAND (GAUZE/BANDAGES/DRESSINGS) ×3 IMPLANT
NS IRRIG 1000ML POUR BTL (IV SOLUTION) ×3 IMPLANT
PAD ARMBOARD 7.5X6 YLW CONV (MISCELLANEOUS) ×3 IMPLANT
POUCH SPECIMEN RETRIEVAL 10MM (ENDOMECHANICALS) ×3 IMPLANT
SCISSORS LAP 5X35 DISP (ENDOMECHANICALS) ×6 IMPLANT
SET CHOLANGIOGRAPH 5 50 .035 (SET/KITS/TRAYS/PACK) IMPLANT
SET IRRIG TUBING LAPAROSCOPIC (IRRIGATION / IRRIGATOR) ×3 IMPLANT
SLEEVE ENDOPATH XCEL 5M (ENDOMECHANICALS) ×6 IMPLANT
SPECIMEN JAR SMALL (MISCELLANEOUS) ×3 IMPLANT
SUT MNCRL AB 4-0 PS2 18 (SUTURE) ×3 IMPLANT
SUT MON AB 4-0 PC3 18 (SUTURE) ×3 IMPLANT
TOWEL OR 17X24 6PK STRL BLUE (TOWEL DISPOSABLE) ×3 IMPLANT
TOWEL OR 17X26 10 PK STRL BLUE (TOWEL DISPOSABLE) ×3 IMPLANT
TRAY LAPAROSCOPIC (CUSTOM PROCEDURE TRAY) ×3 IMPLANT
TROCAR XCEL BLUNT TIP 100MML (ENDOMECHANICALS) ×3 IMPLANT
TROCAR XCEL NON-BLD 5MMX100MML (ENDOMECHANICALS) ×3 IMPLANT
TUBING INSUFFLATION (TUBING) ×3 IMPLANT
WATER STERILE IRR 1000ML POUR (IV SOLUTION) IMPLANT

## 2014-10-15 NOTE — Op Note (Signed)
Laparoscopic Cholecystectomy Procedure Note  Indications: This patient presents with symptomatic gallbladder disease and will undergo laparoscopic cholecystectomy.  Pre-operative Diagnosis: biliary dyskinesia  Post-operative Diagnosis: Same  Surgeon: Coralie Keens A   Assistants: 0  Anesthesia: General endotracheal anesthesia  ASA Class: 1  Procedure Details  The patient was seen again in the Holding Room. The risks, benefits, complications, treatment options, and expected outcomes were discussed with the patient. The possibilities of reaction to medication, pulmonary aspiration, perforation of viscus, bleeding, recurrent infection, finding a normal gallbladder, the need for additional procedures, failure to diagnose a condition, the possible need to convert to an open procedure, and creating a complication requiring transfusion or operation were discussed with the patient. The likelihood of improving the patient's symptoms with return to their baseline status is good.  The patient and/or family concurred with the proposed plan, giving informed consent. The site of surgery properly noted. The patient was taken to Operating Room, identified as Natalie Holloway and the procedure verified as Laparoscopic Cholecystectomy with Intraoperative Cholangiogram. A Time Out was held and the above information confirmed.  Prior to the induction of general anesthesia, antibiotic prophylaxis was administered. General endotracheal anesthesia was then administered and tolerated well. After the induction, the abdomen was prepped with Chloraprep and draped in sterile fashion. The patient was positioned in the supine position.  Local anesthetic agent was injected into the skin near the umbilicus and an incision made. We dissected down to the abdominal fascia with blunt dissection.  The fascia was incised vertically and we entered the peritoneal cavity bluntly.  A pursestring suture of 0-Vicryl was placed around the  fascial opening.  The Hasson cannula was inserted and secured with the stay suture.  Pneumoperitoneum was then created with CO2 and tolerated well without any adverse changes in the patient's vital signs. A 5-mm port was placed in the subxiphoid position.  Two 5-mm ports were placed in the right upper quadrant. All skin incisions were infiltrated with a local anesthetic agent before making the incision and placing the trocars.   We positioned the patient in reverse Trendelenburg, tilted slightly to the patient's left.  The gallbladder was identified and looked thick walled, the fundus grasped and retracted cephalad. Adhesions were lysed bluntly and with the electrocautery where indicated, taking care not to injure any adjacent organs or viscus. The infundibulum was grasped and retracted laterally, exposing the peritoneum overlying the triangle of Calot. This was then divided and exposed in a blunt fashion. The cystic duct was clearly identified and bluntly dissected circumferentially. A critical view of the cystic duct and cystic artery was obtained.  The cystic duct was then ligated with clips and divided. The cystic artery was, dissected free, ligated with clips and divided as well.   The gallbladder was dissected from the liver bed in retrograde fashion with the electrocautery. The gallbladder was removed and placed in an Endocatch sac. The liver bed was irrigated and inspected. Hemostasis was achieved with the electrocautery. Copious irrigation was utilized and was repeatedly aspirated until clear.  The gallbladder and Endocatch sac were then removed through the umbilical port site.  The pursestring suture was used to close the umbilical fascia.    We again inspected the right upper quadrant for hemostasis.  Pneumoperitoneum was released as we removed the trocars.  4-0 Monocryl was used to close the skin.   Benzoin, steri-strips, and clean dressings were applied. The patient was then extubated and brought  to the recovery room in stable condition.  Instrument, sponge, and needle counts were correct at closure and at the conclusion of the case.   Findings: Chronic Cholecystitis without Cholelithiasis  Estimated Blood Loss: Minimal         Drains: 0         Specimens: Gallbladder           Complications: None; patient tolerated the procedure well.         Disposition: PACU - hemodynamically stable.         Condition: stable

## 2014-10-15 NOTE — Progress Notes (Signed)
Patient ID: Natalie Holloway, female   DOB: Oct 24, 1995, 19 y.o.   MRN: [id number]  Patient with RUQ abd pain.  Workup fairly with borderline low GB EF Plan lap chole I discussed the procedure in detail.    We discussed the risks and benefits of a laparoscopic cholecystectomy and possible cholangiogram including, but not limited to bleeding, infection, injury to surrounding structures such as the intestine or liver, bile leak, retained gallstones, need to convert to an open procedure, prolonged diarrhea, blood clots such as  DVT, common bile duct injury, anesthesia risks, and possible need for additional procedures and the chance this may not improve her symptoms.   We discussed the typical post-operative recovery course.

## 2014-10-15 NOTE — Progress Notes (Signed)
Rechecked BP, 158/113.  Dr. Parks Ranger present and saw BP.  Plan to treat with pain medication and reassess after an hour.  Will continue to monitor.

## 2014-10-15 NOTE — Anesthesia Postprocedure Evaluation (Signed)
  Anesthesia Post-op Note  Patient: Natalie Holloway  Procedure(s) Performed: Procedure(s): LAPAROSCOPIC CHOLECYSTECTOMY (N/A)  Patient Location: PACU  Anesthesia Type: General   Level of Consciousness: awake, alert  and oriented  Airway and Oxygen Therapy: Patient Spontanous Breathing  Post-op Pain: mild  Post-op Assessment: Post-op Vital signs reviewed  Post-op Vital Signs: Reviewed  Last Vitals:  Filed Vitals:   10/15/14 0943  BP: 120/74  Pulse: 86  Temp: 36.4 C  Resp: 18    Complications: No apparent anesthesia complications

## 2014-10-15 NOTE — Progress Notes (Signed)
Re-checked pt Bp, MD made aware and okay for the pt to be d/c to home. IV line removed. Discharged instructions given to pt and family. Pt is very eager to go home. NT wheeled pt down.

## 2014-10-15 NOTE — Discharge Instructions (Signed)
[facility] ______[facility] °LAPAROSCOPIC SURGERY: POST OP INSTRUCTIONS °Always review your discharge instruction sheet given to you by the facility where your surgery was performed. °IF YOU HAVE DISABILITY OR FAMILY LEAVE FORMS, YOU MUST BRING THEM TO THE OFFICE FOR PROCESSING.   °DO NOT GIVE THEM TO YOUR DOCTOR. ° °1. A prescription for pain medication may be given to you upon discharge.  Take your pain medication as prescribed, if needed.  If narcotic pain medicine is not needed, then you may take acetaminophen (Tylenol) or ibuprofen (Advil) as needed. °2. Take your usually prescribed medications unless otherwise directed. °3. If you need a refill on your pain medication, please contact your pharmacy.  They will contact our office to request authorization. Prescriptions will not be filled after 5pm or on week-ends. °4. You should follow a light diet the first few days after arrival home, such as soup and crackers, etc.  Be sure to include lots of fluids daily. °5. Most patients will experience some swelling and bruising in the area of the incisions.  Ice packs will help.  Swelling and bruising can take several days to resolve.  °6. It is common to experience some constipation if taking pain medication after surgery.  Increasing fluid intake and taking a stool softener (such as Colace) will usually help or prevent this problem from occurring.  A mild laxative (Milk of Magnesia or Miralax) should be taken according to package instructions if there are no bowel movements after 48 hours. °7. Unless discharge instructions indicate otherwise, you may remove your bandages 24-48 hours after surgery, and you may shower at that time.  You may have steri-strips (small skin tapes) in place directly over the incision.  These strips should be left on the skin for 7-10 days.  If your surgeon used skin glue on the incision, you may shower in 24 hours.  The glue will flake off over the next 2-3 weeks.  Any sutures or  staples will be removed at the office during your follow-up visit. °8. ACTIVITIES:  You may resume regular (light) daily activities beginning the next day--such as daily self-care, walking, climbing stairs--gradually increasing activities as tolerated.  You may have sexual intercourse when it is comfortable.  Refrain from any heavy lifting or straining until approved by your doctor. °a. You may drive when you are no longer taking prescription pain medication, you can comfortably wear a seatbelt, and you can safely maneuver your car and apply brakes. °b. RETURN TO WORK:  __________________________________________________________ °9. You should see your doctor in the office for a follow-up appointment approximately 2-3 weeks after your surgery.  Make sure that you call for this appointment within a day or two after you arrive home to insure a convenient appointment time. °10. OTHER INSTRUCTIONS: __________________________________________________________________________________________________________________________ __________________________________________________________________________________________________________________________ °WHEN TO CALL YOUR DOCTOR: °1. Fever over 101.0 °2. Inability to urinate °3. Continued bleeding from incision. °4. Increased pain, redness, or drainage from the incision. °5. Increasing abdominal pain ° °The clinic staff is available to answer your questions during regular business hours.  Please don’t hesitate to call and ask to speak to one of the nurses for clinical concerns.  If you have a medical emergency, go to the nearest emergency room or call 911.  A surgeon from [facility] is always on call at the hospital. °[address], [address], [place], [place]  [address] ? [address] [address], [place], [place]   [address] °[phone] ? [phone] ? FAX [phone] °Web site:   [email or web address] °

## 2014-10-15 NOTE — Progress Notes (Signed)
Patient ID: Natalie Holloway, female   DOB: 01/25/1996, 19 y.o.   MRN: [id number]  She is doing moderately well post op.  Has some pain from the CO2. Wants to possibly go home later today which is OK from a surgery standpoint. Would see her home with vicodin or percocet and zofran.

## 2014-10-15 NOTE — Progress Notes (Signed)
Family Medicine Teaching Service Daily Progress Note Intern Pager: [phone]  Patient name: Natalie Holloway Medical record number: [id number] Date of birth: Jun 17, 1996 Age: 19 y.o. Gender: female  Primary Care Provider: Karma Greaser, DO Consultants: Gastroenterology Code Status: Full  Assessment and Plan: 19 y.o. female presenting with RUQ abdominal pain, nausea, vomiting, and diarrhea. PMH is significant for HTN, which has since resolved (thought to be d/t OCPs), GERD. Note - significant reported family history of GB disease.  # RUQ Abdominal Pain / Nausea / Vomiting:  Recently seen at [facility] ER on 2/1 with WBC 13.7, Glucose elevated at 139, ketones in urine, lipase normal, and anion gap 20. CT Abd/Pelvis showed no findings for acute abdominal pain (no gallstones) but also showed a small 12m pulmonary nodule with recommendations to follow up in 12 months; no calculi or hydronephrosis noted.  - Vitals norma  UDS positive for opioids and THC - C difficile negative - UKoreaabdomen normal (no gallstones or wall thickening, neg UKoreamurphy's) - Bentyl 187mTID - Tylenol PRN fever/mild pain. Morphine 1q3hr PRN moderate to severe pain. - Zofran PRN nausea, Maalox/Mylanta PRN indigestion, Protonix  - GI consulted. Appreciate recommendations. - HIDA low normal EF.  Surgery consulted, appreciate recommendations, going for cholecystectomy today.  NPO currently after MN  FEN/GI: Clear Liquid Diet >> NPO after MN, NS @ 100 (s/p 2L IVF in ER) Prophylaxis: SCDs  Disposition: Home pending surgery   Subjective:  No acute complaints overnight.  Going for cholecystectomy today   Objective: Temp:  [98.2 F (36.8 C)-98.3 F (36.8 C)] 98.2 F (36.8 C) (02/06 0605) Pulse Rate:  [105-115] 113 (02/06 0605) Resp:  [16-20] 16 (02/06 0605) BP: (115-139)/(72-94) 115/72 mmHg (02/06 0605) SpO2:  [98 %-100 %] 100 % (02/06 067425Physical Exam: General: [age]yoemale resting comfortably in no apparent  distress Cardiovascular: S1 and S2 noted. No murmurs/rubs/gallops. Regular rate and rhythm. Respiratory: Clear to ausculation bilaterally. No wheezes/rales/rhonchi. No increased work of breathing noted. Abdomen: Bowel sounds noted. Soft and nondistended. Tenderness in RUQ and epigastric area. Extremities: No edema noted  Laboratory:  Recent Labs Lab 10/12/14 0920 10/13/14 0719  WBC 15.0* 7.3  HGB 13.5 12.0  HCT 40.4 35.4*  PLT 338 280    Recent Labs Lab 10/12/14 1235 10/13/14 0719  NA 138 138  K 3.8 3.5  CL 107 107  CO2 24 22  BUN 8 6  CREATININE 0.79 0.75  CALCIUM 9.0 8.8  PROT 6.8  --   BILITOT 0.8  --   ALKPHOS 62  --   ALT 21  --   AST 19  --   GLUCOSE 102* 94   Urinalysis    Component Value Date/Time   COLORURINE YELLOW 10/12/2014 1017   APPEARANCEUR CLEAR 10/12/2014 1017   LABSPEC 1.016 10/12/2014 1017   PHURINE 7.5 10/12/2014 1017   GLUCOSEU NEGATIVE 10/12/2014 1017   HGBUR NEGATIVE 10/12/2014 1017   BILIRUBINUR NEGATIVE 10/12/2014 1017   KETONESUR >80* 10/12/2014 1017   PROTEINUR NEGATIVE 10/12/2014 1017   UROBILINOGEN 0.2 10/12/2014 1017   NITRITE NEGATIVE 10/12/2014 1017   LEUKOCYTESUR NEGATIVE 10/12/2014 1017  - hCG negative - pregnancy negative - UDS positive for opioids and THC  Imaging/Diagnostic Tests: Nm Hepatobiliary Liver Func  10/13/2014   CLINICAL DATA:  Abdominal pain and nausea. Right upper quadrant pain. Pain normal abdominal ultrasound. Pain for 3 weeks  EXAM: NUCLEAR MEDICINE HEPATOBILIARY IMAGING WITH GALLBLADDER EF  TECHNIQUE: Sequential images of the abdomen were obtained out to  60 minutes following intravenous administration of radiopharmaceutical. After slow intravenous infusion of 1.5 micrograms Cholecystokinin, gallbladder ejection fraction was determined.  RADIOPHARMACEUTICALS:  5.0 Millicurie [id number] Choletec  COMPARISON:  Ultrasound 10/12/2014  FINDINGS: There is homogeneous radiotracer uptake within the liver. The gallbladder  is evident by 50 min. Counts are present within the small bowel by 20 min. Administration of CCK resulted in minimal contraction of the gallbladder. Gallbladder ejection fraction equals 31 %. At 45 min, normal ejection fraction is greater than 33 %.  IMPRESSION: 1. Patent cystic duct and common bile duct. 2. No evidence of cholecystitis. 3. Borderline Low gallbladder ejection fraction = 31%. Common differential diagnosis includes biliary dyskinesia, chronic cholecystitis, and sphincter Oddi dysfunction.   Electronically Signed   By: Suzy Bouchard M.D.   On: 10/13/2014 17:35   Dg Ugi W/small Bowel  10/14/2014   CLINICAL DATA:  Abdominal pain  EXAM: UPPER GI SERIES WITH SMALL BOWEL FOLLOW-THROUGH  FLUOROSCOPY TIME:  Radiation Exposure Index (as provided by the fluoroscopic device): Not provided  If the device does not provide the exposure index:  Fluoroscopy Time (in minutes and seconds):  1 min 48 seconds  Number of Acquired Images:  5  TECHNIQUE: Combined double contrast and single contrast upper GI series using effervescent crystals, thick barium, and thin barium. Subsequently, serial images of the small bowel were obtained including spot views of the terminal ileum.  COMPARISON:  HIDA scan 10/13/2014, abdominal ultrasound 10/12/2014. The patient reported an outside prior CT abdomen/ pelvis with oral contrast performed in [place] at an outpatient center, not available for direct comparison, performed 10/12/2014.  FINDINGS: The esophagus is normal in course and caliber. Mucosa appears normal. No obstruction to distal passage of contrast. Piriform sinuses and valleculae appear normal.  The stomach and duodenal bulb appear normal. Mucosal detail was visualized at real time imaging and appears normal but the view of the fundus was inadvertently not electronically saved at the time of real time imaging. Ligament of Treitz is appropriately located. Esophageal, gastric, and small bowel motility appear normal at  real time imaging.  Small bowel follow-through demonstrates normal position and appearance of loops of bowel throughout the abdomen. The appendix and colon were visualized.  IMPRESSION: Normal upper GI and small-bowel follow-through. Discussed with Dr. Amedeo Plenty at the time of imaging by Dr. Nyoka Cowden.   Electronically Signed   By: Conchita Paris M.D.   On: 10/14/2014 17:20   Nolon Rod, DO 10/15/2014, 6:48 AM PGY-3, [place] Intern pager: [phone], text pages welcome

## 2014-10-15 NOTE — Anesthesia Preprocedure Evaluation (Addendum)
Anesthesia Evaluation  Patient identified by MRN, date of birth, ID band Patient awake    Reviewed: Allergy & Precautions, NPO status , Patient's Chart, lab work & pertinent test results  Airway Mallampati: I  TM Distance: >3 FB Neck ROM: Full    Dental  (+) Teeth Intact, Dental Advisory Given   Pulmonary  breath sounds clear to auscultation        Cardiovascular Rhythm:Regular Rate:Normal  Denies htn.  States previous hx htn while on BCP.  None now    Neuro/Psych    GI/Hepatic   Endo/Other    Renal/GU      Musculoskeletal   Abdominal   Peds  Hematology   Anesthesia Other Findings   Reproductive/Obstetrics                            Anesthesia Physical Anesthesia Plan  ASA: I  Anesthesia Plan: General   Post-op Pain Management:    Induction: Intravenous  Airway Management Planned: Oral ETT  Additional Equipment:   Intra-op Plan:   Post-operative Plan: Extubation in OR  Informed Consent: I have reviewed the patients History and Physical, chart, labs and discussed the procedure including the risks, benefits and alternatives for the proposed anesthesia with the patient or authorized representative who has indicated his/her understanding and acceptance.   Dental advisory given  Plan Discussed with: CRNA, Anesthesiologist and Surgeon  Anesthesia Plan Comments:         Anesthesia Quick Evaluation

## 2014-10-15 NOTE — Anesthesia Procedure Notes (Signed)
Procedure Name: Intubation Date/Time: 10/15/2014 8:03 AM Performed by: Marinda Elk A Pre-anesthesia Checklist: Patient identified, Timeout performed, Emergency Drugs available, Suction available and Patient being monitored Patient Re-evaluated:Patient Re-evaluated prior to inductionOxygen Delivery Method: Circle system utilized Preoxygenation: Pre-oxygenation with 100% oxygen Intubation Type: IV induction, Rapid sequence and Cricoid Pressure applied Laryngoscope Size: Mac and 3 Grade View: Grade I Tube type: Oral Tube size: 7.5 mm Number of attempts: 1 Airway Equipment and Method: Stylet Placement Confirmation: ETT inserted through vocal cords under direct vision and breath sounds checked- equal and bilateral Secured at: 20 cm Tube secured with: Tape Dental Injury: Teeth and Oropharynx as per pre-operative assessment

## 2014-10-15 NOTE — Transfer of Care (Signed)
Immediate Anesthesia Transfer of Care Note  Patient: Natalie Holloway  Procedure(s) Performed: Procedure(s): LAPAROSCOPIC CHOLECYSTECTOMY (N/A)  Patient Location: PACU  Anesthesia Type:General  Level of Consciousness: awake  Airway & Oxygen Therapy: Patient Spontanous Breathing  Post-op Assessment: Report given to RN and Post -op Vital signs reviewed and stable  Post vital signs: Reviewed and stable  Last Vitals:  Filed Vitals:   10/15/14 0605  BP: 115/72  Pulse: 113  Temp: 36.8 C  Resp: 16    Complications: No apparent anesthesia complications

## 2014-10-15 NOTE — Discharge Summary (Signed)
[facility] Discharge Summary  Patient name: Menard record number: [id number] Date of birth: [birth year] Age: 19 y.o. Gender: female Date of Admission: 10/12/2014  Date of Discharge: 10/15/14 Admitting Physician: Dickie La, MD  Primary Care Provider: Karma Greaser, DO Consultants: Gastroenterology, General Surgery  Indication for Hospitalization: RUQ Abdominal Pain  Discharge Diagnoses/Problem List:  HTN Nausea S/P Cholecystectomy  Disposition: Discharge Home  Discharge Condition: Stable  Brief Hospital Course:  Mrs. [place] Crane is a 19yo female who presented to the Emergency Department on 10/12/14 with RUQ abdominal pain, nausea, vomiting, and diarrhea. CT scan at previous ED visit showed no findings for acute abdominal pain. WBC 15 at admission, which improved throughout hospitalization. Pregnancy test negative. Urinalysis negative. UDS positive for opioids and THC. C. difficile negative. CXR showed no acute cardiopulmonary disease. US abdomen negative with no gallstones or wall thickening and no [place] murphy's. Clear liquid diet initiated.  Gastroenterology consulted. HIDA scan done on 2/4 and showed borderline low EF of 31%. Normal upper GI and small bowel follow through. General Surgery consulted for cholecystectomy, which occurred on 2/6. Discharged on 2/6 following cholecystectomy.   Issues for Follow Up:  1. Small 58m Pulmonary Nodule on CT. Follow up in 12 months. 2. Follow up nausea and pain. 3. Follow up BP. Elevated during hospitalization, but thought to be secondary to pain. History of HTN in past secondary to OCPs.  Significant Procedures: HIDA Scan, Cholecystectomy  Significant Labs and Imaging:   Recent Labs Lab 10/12/14 0920 10/13/14 0719  WBC 15.0* 7.3  HGB 13.5 12.0  HCT 40.4 35.4*  PLT 338 280    Recent Labs Lab 10/12/14 1235 10/13/14 0719  NA 138 138  K 3.8 3.5  CL 107 107  CO2 24 22  GLUCOSE 102* 94  BUN 8 6   CREATININE 0.79 0.75  CALCIUM 9.0 8.8  ALKPHOS 62  --   AST 19  --   ALT 21  --   ALBUMIN 4.1  --    Urinalysis    Component Value Date/Time   COLORURINE YELLOW 10/12/2014 1017   APPEARANCEUR CLEAR 10/12/2014 1017   LABSPEC 1.016 10/12/2014 1017   PHURINE 7.5 10/12/2014 1017   GLUCOSEU NEGATIVE 10/12/2014 1017   HGBUR NEGATIVE 10/12/2014 1017   BILIRUBINUR NEGATIVE 10/12/2014 1017   KETONESUR >80* 10/12/2014 1017   PROTEINUR NEGATIVE 10/12/2014 1017   UROBILINOGEN 0.2 10/12/2014 1017   NITRITE NEGATIVE 10/12/2014 1017   LEUKOCYTESUR NEGATIVE 10/12/2014 1017  - Beta hCG- negative - Pregnancy test negative - C. Difficile negative - UDS positive for opioids and THC  Dg Chest 2 View  10/12/2014   CLINICAL DATA:  generalized abd pain and nausea for 3 weeks. Worse for the past few days. [facility]  EXAM: CHEST - 2 VIEW  COMPARISON:  08/21/2012  FINDINGS: Lungs are clear. Heart size and mediastinal contours are within normal limits. No effusion. Visualized skeletal structures are unremarkable.  IMPRESSION: No acute cardiopulmonary disease.   Electronically Signed   By: DArne ClevelandM.D.   On: 10/12/2014 12:21   Nm Hepatobiliary Liver Func  10/13/2014   CLINICAL DATA:  Abdominal pain and nausea. Right upper quadrant pain. Pain normal abdominal ultrasound. Pain for 3 weeks  EXAM: NUCLEAR MEDICINE HEPATOBILIARY IMAGING WITH GALLBLADDER EF  TECHNIQUE: Sequential images of the abdomen were obtained out to 60 minutes following intravenous administration of radiopharmaceutical. After slow intravenous infusion of  1.5 micrograms Cholecystokinin, gallbladder ejection fraction was determined.  RADIOPHARMACEUTICALS:  5.0 [place] KG-81E Choletec  COMPARISON:  Ultrasound 10/12/2014  FINDINGS: There is homogeneous radiotracer uptake within the liver. The gallbladder is evident by 50 min. Counts are present within the small bowel by 20 min.  Administration of CCK resulted in minimal contraction of the gallbladder. Gallbladder ejection fraction equals 31 %. At 45 min, normal ejection fraction is greater than 33 %.  IMPRESSION: 1. Patent cystic duct and common bile duct. 2. No evidence of cholecystitis. 3. Borderline Low gallbladder ejection fraction = 31%. Common differential diagnosis includes biliary dyskinesia, chronic cholecystitis, and sphincter Oddi dysfunction.   Electronically Signed   By: Suzy Bouchard M.D.   On: 10/13/2014 17:35   US Abdomen Complete  10/12/2014   CLINICAL DATA:  Right upper quadrant abdominal pain for 3 weeks with nausea and vomiting. Initial encounter.  EXAM: ULTRASOUND ABDOMEN COMPLETE  COMPARISON:  Abdominal ultrasound and CT 10/04/2010.  FINDINGS: Gallbladder: No gallstones or wall thickening visualized. No sonographic Murphy sign noted.  Common bile duct: Diameter: 1.8 mm. No evidence of choledocholithiasis.  Liver: No focal lesion identified. Within normal limits in parenchymal echogenicity.  IVC: No abnormality visualized.  Pancreas: Visualized portion unremarkable.  Spleen: Size and appearance within normal limits.  Right Kidney: Length: 11.1 cm. Echogenicity within normal limits. No mass or hydronephrosis visualized.  Left Kidney: Length: 11.8 cm. Echogenicity within normal limits. No mass or hydronephrosis visualized.  Abdominal aorta: No aneurysm visualized.  Other findings: None.  IMPRESSION: Normal abdominal ultrasound.   Electronically Signed   By: Camie Patience M.D.   On: 10/12/2014 11:15   Dg Ugi W/small Bowel  10/14/2014   CLINICAL DATA:  Abdominal pain  EXAM: UPPER GI SERIES WITH SMALL BOWEL FOLLOW-THROUGH  FLUOROSCOPY TIME:  Radiation Exposure Index (as provided by the fluoroscopic device): Not provided  If the device does not provide the exposure index:  Fluoroscopy Time (in minutes and seconds):  1 min 48 seconds  Number of Acquired Images:  5  TECHNIQUE: Combined double contrast and single contrast  upper GI series using effervescent crystals, thick barium, and thin barium. Subsequently, serial images of the small bowel were obtained including spot views of the terminal ileum.  COMPARISON:  HIDA scan 10/13/2014, abdominal ultrasound 10/12/2014. The patient reported an outside prior CT abdomen/ pelvis with oral contrast performed in [place] at an outpatient center, not available for direct comparison, performed 10/12/2014.  FINDINGS: The esophagus is normal in course and caliber. Mucosa appears normal. No obstruction to distal passage of contrast. Piriform sinuses and valleculae appear normal.  The stomach and duodenal bulb appear normal. Mucosal detail was visualized at real time imaging and appears normal but the view of the fundus was inadvertently not electronically saved at the time of real time imaging. Ligament of Treitz is appropriately located. Esophageal, gastric, and small bowel motility appear normal at real time imaging.  Small bowel follow-through demonstrates normal position and appearance of loops of bowel throughout the abdomen. The appendix and colon were visualized.  IMPRESSION: Normal upper GI and small-bowel follow-through. Discussed with Dr. Amedeo Plenty at the time of imaging by Dr. Nyoka Cowden.   Electronically Signed   By: Conchita Paris M.D.   On: 10/14/2014 17:20   Results/Tests Pending at Time of Discharge: None  Discharge Medications:    Medication List    TAKE these medications        dicyclomine 10 MG capsule  Commonly known as:  BENTYL  Take 10 mg by mouth 3 (three) times daily.     famotidine 20 MG tablet  Commonly known as:  PEPCID  Take 20 mg by mouth 2 (two) times daily.     metoprolol succinate 25 MG 24 hr tablet  Commonly known as:  TOPROL XL  Take 1 tablet (25 mg total) by mouth daily.     omeprazole 10 MG capsule  Commonly known as:  PRILOSEC  Take 10 mg by mouth daily.     oxyCODONE-acetaminophen 5-325 MG per tablet  Commonly known as:   PERCOCET/ROXICET  Take 1-2 tablets by mouth every 4 (four) hours as needed for severe pain.     promethazine 25 MG tablet  Commonly known as:  PHENERGAN  Take 1 tablet (25 mg total) by mouth every 6 (six) hours as needed for nausea or vomiting.     promethazine 25 MG suppository  Commonly known as:  PHENERGAN  Place 1 suppository (25 mg total) rectally every 6 (six) hours as needed for nausea or vomiting.        Discharge Instructions: Please refer to Patient Instructions section of EMR for full details.  Patient was counseled important signs and symptoms that should prompt return to medical care, changes in medications, dietary instructions, activity restrictions, and follow up appointments.   Follow-Up Appointments: Follow-up Information    Follow up with [facility], MD. Schedule an appointment as soon as possible for a visit in 2 weeks.   Specialty:  General Surgery   Why:  post-op surgical site check   Contact information:   [address] [place] [address] [phone]       Follow up with [facility], DO.   Specialty:  Family Medicine   Why:  hospital follow-up appointment      Lorna Few, DO 10/17/2014, 4:16 PM PGY-1, [place]

## 2014-10-17 ENCOUNTER — Encounter (HOSPITAL_COMMUNITY): Payer: Self-pay | Admitting: Surgery

## 2017-07-03 ENCOUNTER — Emergency Department (HOSPITAL_COMMUNITY)
Admission: EM | Admit: 2017-07-03 | Discharge: 2017-07-03 | Disposition: A | Discharge status: Home / Self Care | Payer: BC Managed Care – PPO | Attending: Emergency Medicine | Admitting: Emergency Medicine

## 2017-07-03 ENCOUNTER — Encounter: Payer: Self-pay | Admitting: Nurse Practitioner

## 2017-07-03 ENCOUNTER — Encounter (HOSPITAL_COMMUNITY): Payer: Self-pay | Admitting: *Deleted

## 2017-07-03 DIAGNOSIS — I10 Essential (primary) hypertension: Secondary | ICD-10-CM | POA: Diagnosis not present

## 2017-07-03 DIAGNOSIS — Z79899 Other long term (current) drug therapy: Secondary | ICD-10-CM | POA: Insufficient documentation

## 2017-07-03 DIAGNOSIS — K2950 Unspecified chronic gastritis without bleeding: Secondary | ICD-10-CM | POA: Diagnosis not present

## 2017-07-03 DIAGNOSIS — R197 Diarrhea, unspecified: Secondary | ICD-10-CM

## 2017-07-03 DIAGNOSIS — K529 Noninfective gastroenteritis and colitis, unspecified: Secondary | ICD-10-CM

## 2017-07-03 DIAGNOSIS — R112 Nausea with vomiting, unspecified: Secondary | ICD-10-CM

## 2017-07-03 DIAGNOSIS — F1290 Cannabis use, unspecified, uncomplicated: Secondary | ICD-10-CM | POA: Insufficient documentation

## 2017-07-03 DIAGNOSIS — R109 Unspecified abdominal pain: Secondary | ICD-10-CM

## 2017-07-03 DIAGNOSIS — G8929 Other chronic pain: Secondary | ICD-10-CM

## 2017-07-03 DIAGNOSIS — G43A0 Cyclical vomiting, not intractable: Secondary | ICD-10-CM

## 2017-07-03 DIAGNOSIS — E860 Dehydration: Secondary | ICD-10-CM | POA: Diagnosis not present

## 2017-07-03 DIAGNOSIS — R1012 Left upper quadrant pain: Secondary | ICD-10-CM | POA: Diagnosis present

## 2017-07-03 LAB — COMPREHENSIVE METABOLIC PANEL
ALT: 28 U/L (ref 14–54)
AST: 22 U/L (ref 15–41)
Albumin: 4.3 g/dL (ref 3.5–5.0)
Alkaline Phosphatase: 64 U/L (ref 38–126)
Anion gap: 11 (ref 5–15)
BILIRUBIN TOTAL: 0.8 mg/dL (ref 0.3–1.2)
BUN: 9 mg/dL (ref 6–20)
CALCIUM: 9.5 mg/dL (ref 8.9–10.3)
CO2: 20 mmol/L — ABNORMAL LOW (ref 22–32)
CREATININE: 0.85 mg/dL (ref 0.44–1.00)
Chloride: 105 mmol/L (ref 101–111)
GFR calc Af Amer: >60 mL/min (ref >60)
Glucose, Bld: 159 mg/dL — ABNORMAL HIGH (ref 65–99)
Potassium: 3.7 mmol/L (ref 3.5–5.1)
Sodium: 136 mmol/L (ref 135–145)
TOTAL PROTEIN: 7.6 g/dL (ref 6.5–8.1)

## 2017-07-03 LAB — URINALYSIS, ROUTINE W REFLEX MICROSCOPIC
Bilirubin Urine: NEGATIVE
Glucose, UA: 50 mg/dL — AB
Hgb urine dipstick: NEGATIVE
KETONES UR: 80 mg/dL — AB
Leukocytes, UA: NEGATIVE
NITRITE: NEGATIVE
PH: 9 — AB (ref 5.0–8.0)
Protein, ur: 100 mg/dL — AB
Specific Gravity, Urine: 1.016 (ref 1.005–1.030)

## 2017-07-03 LAB — RAPID URINE DRUG SCREEN, HOSP PERFORMED
Amphetamines: NOT DETECTED
Barbiturates: NOT DETECTED
Benzodiazepines: NOT DETECTED
COCAINE: NOT DETECTED
OPIATES: NOT DETECTED
Tetrahydrocannabinol: POSITIVE — AB

## 2017-07-03 LAB — LIPASE, BLOOD: Lipase: 23 U/L (ref 11–51)

## 2017-07-03 LAB — CBC
HCT: 38 % (ref 36.0–46.0)
Hemoglobin: 12.6 g/dL (ref 12.0–15.0)
MCH: 27.8 pg (ref 26.0–34.0)
MCHC: 33.2 g/dL (ref 30.0–36.0)
MCV: 83.9 fL (ref 78.0–100.0)
Platelets: 336 10*3/uL (ref 150–400)
RBC: 4.53 MIL/uL (ref 3.87–5.11)
RDW: 13.3 % (ref 11.5–15.5)
WBC: 13.9 10*3/uL — ABNORMAL HIGH (ref 4.0–10.5)

## 2017-07-03 LAB — I-STAT BETA HCG BLOOD, ED (MC, WL, AP ONLY): I-stat hCG, quantitative: <5 m[IU]/mL (ref <5)

## 2017-07-03 MED ORDER — ONDANSETRON 4 MG PO TBDP: 4.0000 mg | ORAL_TABLET | Freq: Three times a day (TID) | ORAL | 0 refills | Status: DC | PRN

## 2017-07-03 MED ORDER — SODIUM CHLORIDE 0.9 % IV BOLUS (SEPSIS)
2000.0000 mL | Freq: Once | INTRAVENOUS | Status: AC
  Administered 2017-07-03: 2000 mL via INTRAVENOUS

## 2017-07-03 MED ORDER — FAMOTIDINE IN NACL 20-0.9 MG/50ML-% IV SOLN
20.0000 mg | Freq: Once | INTRAVENOUS | Status: AC
Start: 2017-07-03 — End: 2017-07-03
  Administered 2017-07-03: 20 mg via INTRAVENOUS
  Filled 2017-07-03: qty 50

## 2017-07-03 MED ORDER — MORPHINE SULFATE (PF) 4 MG/ML IV SOLN
4.0000 mg | Freq: Once | INTRAVENOUS | Status: AC
  Administered 2017-07-03: 4 mg via INTRAVENOUS
  Filled 2017-07-03: qty 1

## 2017-07-03 MED ORDER — ONDANSETRON HCL 4 MG/2ML IJ SOLN
4.0000 mg | Freq: Once | INTRAMUSCULAR | Status: AC
  Administered 2017-07-03: 4 mg via INTRAVENOUS
  Filled 2017-07-03: qty 2

## 2017-07-03 MED ORDER — RANITIDINE HCL 150 MG PO TABS: 150.0000 mg | ORAL_TABLET | Freq: Two times a day (BID) | ORAL | 0 refills | Status: DC

## 2017-07-03 MED ORDER — SODIUM CHLORIDE 0.9 % IV BOLUS (SEPSIS)
1000.0000 mL | Freq: Once | INTRAVENOUS | Status: AC
  Administered 2017-07-03: 1000 mL via INTRAVENOUS

## 2017-07-03 MED ORDER — HYDROMORPHONE HCL 1 MG/ML IJ SOLN
1.0000 mg | Freq: Once | INTRAMUSCULAR | Status: AC
  Administered 2017-07-03: 1 mg via INTRAVENOUS
  Filled 2017-07-03: qty 1

## 2017-07-03 MED ORDER — LORAZEPAM 2 MG/ML IJ SOLN
1.0000 mg | Freq: Once | INTRAMUSCULAR | Status: AC
Start: 2017-07-03 — End: 2017-07-03
  Administered 2017-07-03: 1 mg via INTRAVENOUS
  Filled 2017-07-03: qty 1

## 2017-07-03 NOTE — Discharge Instructions (Signed)
Your abdominal pain is likely from gastritis or an ulcer, or could be related to your marijuana use. You will need to take zantac as directed, continue taking your home medications including the protonix (pantoprazole) which you were given at your last ER visit ([place] DON'T WANT TO ABRUPTLY STOP TAKING; MAKE SURE [place]; IF YOU DON'T HAVE ANY LEFT, THEN GET PRILOSEC AT [facility]); avoid spicy/fatty/acidic foods, avoid soda/coffee/tea/alcohol. Avoid laying down flat within 30 minutes of eating. Avoid NSAIDs like ibuprofen/aleve/motrin/etc on an empty stomach. Continue using your home bentyl to help with diarrhea and cramps; may consider using pepto bismol or imodium as well. Eat a BRAT diet as outlined below, to help with diarrhea. May consider using over the counter tums/maalox as needed for additional relief. Use zofran and/or home phenergan as directed as needed for nausea. Use tylenol as needed for pain. Follow up with your regular doctor in 5-7 days for recheck of symptoms, and with the gastroenterologist in the next 1-2 weeks for ongoing management of your condition. Return to the ER for changes or worsening symptoms.  Abdominal (belly) pain can be caused by many things. Your caregiver performed an examination and possibly ordered blood/urine tests and imaging (CT scan, x-rays, ultrasound). Many cases can be observed and treated at home after initial evaluation in the emergency department. Even though you are being discharged home, abdominal pain can be unpredictable. Therefore, you need a repeated exam if your pain does not resolve, returns, or worsens. Most patients with abdominal pain don't have to be admitted to the hospital or have surgery, but serious problems like appendicitis and gallbladder attacks can start out as nonspecific pain. Many abdominal conditions cannot be diagnosed in one visit, so follow-up evaluations are very important. SEEK  IMMEDIATE MEDICAL ATTENTION IF YOU DEVELOP ANY OF THE FOLLOWING SYMPTOMS: The pain does not go away or becomes severe.  A temperature above 101 develops.  Repeated vomiting occurs (multiple episodes).  The pain becomes localized to portions of the abdomen. The right side could possibly be appendicitis. In an adult, the left lower portion of the abdomen could be colitis or diverticulitis.  Blood is being passed in stools or vomit (bright red or black tarry stools).  Return also if you develop chest pain, difficulty breathing, dizziness or fainting, or become confused, poorly responsive, or inconsolable (young children). The constipation stays for more than 4 days.  There is belly (abdominal) or rectal pain.  You do not seem to be getting better.

## 2017-07-03 NOTE — ED Notes (Signed)
Pt verbalized understanding of d/c instructions and has no further questions. Pt has appointment with [facility] on 11/5. Removed all belongings from room.

## 2017-07-03 NOTE — ED Notes (Addendum)
Pt family came to nurses station stating pt "is passing out", approached pt to find her opening her eyes. Pt wheeled back to room.

## 2017-07-03 NOTE — ED Triage Notes (Addendum)
To ED for eval of generalized abd pain. Pt come to [facility] from [facility] ED 'because they weren't doing anything'. Pt moaning and crying loudly. Dry heaving. Per family, pt is to follow up with endo but unable to see them until November 1st. Pt is trying to get pregnant. Not on birth control.

## 2017-07-03 NOTE — ED Notes (Signed)
This RN in room with pt, pt attempted to ambulate to the restroom but got dizzy and stated to this RN "It feels like my legs won't work" Pt assisted back to bed and then helped onto bedside commode.

## 2017-07-03 NOTE — ED Provider Notes (Signed)
[facility] EMERGENCY DEPARTMENT Provider Note   CSN: [id number] Arrival date & time: 07/03/17  1019     History   Chief Complaint Chief Complaint  Patient presents with  . Abdominal Pain    HPI DALANA PFAHLER is a 21 y.o. female with a PMHx of HTN, GERD, ovarian cysts, and PSHx of cholecystectomy, who presents to the ED with complaints of abdominal pain that has been going on for several months with periodic worsening randomly. Patient states that she was seen at [facility] ER on 05/28/17, 06/27/17, as well as today just prior to arrival, and has had 2 CT scans of her abdomen, one without contrast and one with contrast, which did not show anything definitive (husband states "it showed may be an ulcer or early Crohn's",?Colitis). She did not feel as though she was getting adequate care at [facility] so she came here for further evaluation. She states that the pain is usually a dull constant pain mostly in the LUQ however it randomly will "spasm" and get worse. She states that the pain started worsening today. She describes the pain as 10/10 constant dull and intermittent spasming LUQ pain that radiates into the left lower back, worse with spicy food or "junk food", and unrelieved with Protonix, Phenergan, bentyl, and oxycodone which were prescribed at the [facility] ER. She reports associated nausea, nonbloody nonbilious emesis (TNTC), and constant diarrhea (can't quantify, but states <10x/day, watery). She reports that she ate tacos last night which worsened her symptoms. Her PCP is at [facility] primary care. She has not yet gone to a GI specialist, however has an appointment next month. LMP was 1 week ago, she states these have been fairly irregular over the last several months. She endorses fairly regular NSAID use.  She denies fevers, chills, CP, SOB, constipation, obstipation, melena, hematochezia, hematemesis, hematuria, dysuria, vaginal bleeding/discharge, myalgias, arthralgias,  numbness, tingling, focal weakness, or any other complaints at this time. Denies recent travel, sick contacts, suspicious food intake, or EtOH use.    The history is provided by the patient and medical records. No language interpreter was used.  Abdominal Pain   This is a recurrent problem. The current episode started more than 1 week ago. The problem occurs constantly. The problem has not changed since onset.The pain is associated with eating. The pain is located in the LUQ. The quality of the pain is dull and cramping. The pain is at a severity of 10/10. The pain is moderate. Associated symptoms include diarrhea, nausea and vomiting. Pertinent negatives include fever, flatus, hematochezia, melena, constipation, dysuria, hematuria, arthralgias and myalgias. The symptoms are aggravated by eating. Nothing relieves the symptoms. Past workup includes CT scan. Her past medical history is significant for GERD.    Past Medical History:  Diagnosis Date  . Essential hypertension    resolved when discontinued OCP's  . GERD (gastroesophageal reflux disease)    Prilosec  . Ovarian cyst    no current problems as of 10/14/14    Patient Active Problem List   Diagnosis Date Noted  . Diarrhea 10/12/2014  . RUQ abdominal pain   . Upper abdominal pain   . Nausea with vomiting   . Essential hypertension 08/21/2012    Past Surgical History:  Procedure Laterality Date  . CHOLECYSTECTOMY N/A 10/15/2014   Procedure: LAPAROSCOPIC CHOLECYSTECTOMY;  Surgeon: Coralie Keens, MD;  Location: [place];  Service: General;  Laterality: N/A;  . FRENULECTOMY, UPPER LABIAL      OB History  No data available       Home Medications    Prior to Admission medications   Medication Sig Start Date End Date Taking? Authorizing Provider  dicyclomine (BENTYL) 10 MG capsule Take 10 mg by mouth 3 (three) times daily. 09/27/14   [provider]  famotidine (PEPCID) 20 MG tablet Take 20 mg by mouth 2 (two) times  daily. 10/10/14 10/10/15  [provider]  metoprolol succinate (TOPROL XL) 25 MG 24 hr tablet Take 1 tablet (25 mg total) by mouth daily. Patient not taking: Reported on 10/12/2014 12/01/13   Darlin Coco, MD  omeprazole (PRILOSEC) 10 MG capsule Take 10 mg by mouth daily.    [provider]  oxyCODONE-acetaminophen (PERCOCET/ROXICET) 5-325 MG per tablet Take 1-2 tablets by mouth every 4 (four) hours as needed for severe pain. 10/15/14   Karamalegos, Devonne Doughty, DO  promethazine (PHENERGAN) 25 MG suppository Place 1 suppository (25 mg total) rectally every 6 (six) hours as needed for nausea or vomiting. 10/15/14   Karamalegos, Devonne Doughty, DO  promethazine (PHENERGAN) 25 MG tablet Take 1 tablet (25 mg total) by mouth every 6 (six) hours as needed for nausea or vomiting. 10/15/14   Olin Hauser, DO    Family History No family history on file.  Social History Social History  Substance Use Topics  . Smoking status: Never Smoker  . Smokeless tobacco: Not on file  . Alcohol use No     Allergies   Patient has no known allergies.   Review of Systems Review of Systems  Constitutional: Negative for chills and fever.  Respiratory: Negative for shortness of breath.   Cardiovascular: Negative for chest pain.  Gastrointestinal: Positive for abdominal pain, diarrhea, nausea and vomiting. Negative for blood in stool, constipation, flatus, hematochezia and melena.  Genitourinary: Negative for dysuria, hematuria, vaginal bleeding and vaginal discharge.  Musculoskeletal: Negative for arthralgias and myalgias.  Skin: Negative for color change.  Allergic/Immunologic: Negative for immunocompromised state.  Neurological: Negative for weakness and numbness.  Psychiatric/Behavioral: Negative for confusion.   All other systems reviewed and are negative for acute change except as noted in the HPI.    Physical Exam Updated Vital Signs BP (!) 135/110 (BP Location: Right Arm)    Pulse (!) 106   Temp 97.6 F (36.4 C) (Oral)   Resp (!) 22   SpO2 100%   Physical Exam  Constitutional: She is oriented to person, place, and time. Vital signs are normal. She appears well-developed and well-nourished.  Non-toxic appearance. She appears distressed.  Afebrile, nontoxic, very anxious appearing, dry heaving throughout exam, requesting fluids and pain medication repeatedly  HENT:  Head: Normocephalic and atraumatic.  Mouth/Throat: Oropharynx is clear and moist. Mucous membranes are dry.  Slightly dry lips  Eyes: Conjunctivae and EOM are normal. Right eye exhibits no discharge. Left eye exhibits no discharge.  Neck: Normal range of motion. Neck supple.  Cardiovascular: Regular rhythm, normal heart sounds and intact distal pulses.  Tachycardia present.  Exam reveals no gallop and no friction rub.   No murmur heard. Mildly tachycardic in the low 100s  Pulmonary/Chest: Effort normal and breath sounds normal. No respiratory distress. She has no decreased breath sounds. She has no wheezes. She has no rhonchi. She has no rales.  Abdominal: Soft. Normal appearance and bowel sounds are normal. She exhibits no distension. There is generalized tenderness. There is no rigidity, no rebound, no guarding, no CVA tenderness, no tenderness at McBurney's point and negative Murphy's sign.  Soft, nondistended, +BS throughout, with diffuse TTP throughout entire abdomen but more focally in the LUQ, no r/g/r, neg murphy's, neg mcburney's, no CVA TTP   Musculoskeletal: Normal range of motion.  Neurological: She is alert and oriented to person, place, and time. She has normal strength. No sensory deficit.  Skin: Skin is warm, dry and intact. No rash noted.  Psychiatric: She has a normal mood and affect.  Nursing note and vitals reviewed.    ED Treatments / Results  Labs (all labs ordered are listed, but only abnormal results are displayed) Labs Reviewed  COMPREHENSIVE METABOLIC PANEL - Abnormal;  Notable for the following:       Result Value   CO2 20 (*)    Glucose, Bld 159 (*)    All other components within normal limits  CBC - Abnormal; Notable for the following:    WBC 13.9 (*)    All other components within normal limits  URINALYSIS, ROUTINE W REFLEX MICROSCOPIC - Abnormal; Notable for the following:    APPearance TURBID (*)    pH 9.0 (*)    Glucose, UA 50 (*)    Ketones, ur 80 (*)    Protein, ur 100 (*)    Bacteria, UA RARE (*)    Squamous Epithelial / LPF 0-5 (*)    All other components within normal limits  RAPID URINE DRUG SCREEN, HOSP PERFORMED - Abnormal; Notable for the following:    Tetrahydrocannabinol POSITIVE (*)    All other components within normal limits  LIPASE, BLOOD  I-STAT BETA HCG BLOOD, ED ([facility], [facility], [facility] ONLY)    EKG  EKG Interpretation  Date/Time:  Thursday July 03 2017 11:20:45 EDT Ventricular Rate:  102 PR Interval:    QRS Duration: 99 QT Interval:  363 QTC Calculation: 473 R Axis:   90 Text Interpretation:  Sinus tachycardia No previous tracing Confirmed by Lajean Saver [phone]) on 07/03/2017 11:25:56 AM       Radiology No results found.   CT abd/pelv with contrast 06/27/17 @ [facility]: 1. There is focal wall thickening in the distal gastric antrum and pylorus, likely indicative of distal gastritis. No fistula or surrounding mesenteric thickening evident in this area. No other bowel wall thickening appreciable. No bowel obstruction. Terminal ileum appears normal.  2. Appendix appears normal. No abscess.  3. No renal or ureteral calculus. No hydronephrosis.  4. Gallbladder absent.  CT abd/pelv with contrast 05/28/17 @ [facility]: 1. Wall thickening in several loops of jejunum. Suspect a degree of enteritis. No bowel obstruction evident.  2. No demonstrable abscess. Appendix appears normal.  3. No renal or ureteral calculus. No hydronephrosis.  4. Small nodular lesions in the lung bases, almost certainly benign in this age group.   5. Apparent dominant follicle left ovary. This finding is felt to be a physiologic finding.   Procedures Procedures (including critical care time)  Medications Ordered in ED Medications  sodium chloride 0.9 % bolus 2,000 mL (0 mLs Intravenous Stopped 07/03/17 1404)  LORazepam (ATIVAN) injection 1 mg (1 mg Intravenous Given 07/03/17 1206)  morphine 4 MG/ML injection 4 mg (4 mg Intravenous Given 07/03/17 1206)  famotidine (PEPCID) IVPB 20 mg premix (0 mg Intravenous Stopped 07/03/17 1236)  ondansetron (ZOFRAN) injection 4 mg (4 mg Intravenous Given 07/03/17 1205)  HYDROmorphone (DILAUDID) injection 1 mg (1 mg Intravenous Given 07/03/17 1317)  sodium chloride 0.9 % bolus 1,000 mL (0 mLs Intravenous Stopped 07/03/17 1513)     Initial Impression / Assessment and  Plan / ED Course  I have reviewed the triage vital signs and the nursing notes.  Pertinent labs & imaging results that were available during my care of the patient were reviewed by me and considered in my medical decision making (see chart for details).     21 y.o. female here with months of LUQ pain that has persistently flared up at different moments in time, she has been seen at [place] ED 3 times in the last month and reportedly had 2 CT scans (?colitis on one) but feels she hasn't received adequate care so she came here. Reports n/v/d. Seems to be triggered by "junk food". On exam, very anxious, dry heaving, mildly tachycardic, lips slightly dry, with diffuse abd TTP more in LUQ but complains of pain everywhere, no flank tenderness, nonperitoneal. Labs fairly reassuring: CBC with marginally elevated WBC 13.9 which I suspect may be from stress demargination; CMP with gluc 159 likely from stress surge, and slightly low bicarb of 20 probably from dehydration, no anion gap, and otherwise unremarkable; lipase WNL; betaHCG neg. Awaiting U/A. Will attempt to get records from [place] so we can figure out what her imaging has shown,  before doing any imaging here. Will give ativan, fluids, morphine, pepcid, and zofran. Will reassess shortly.   3:29 PM U/A with ketones and protein, indicative of dehydration, 0-5 squamous so slightly contaminated, rare bacteria and nitrite/leuk neg, doubt UTI. Still slightly tachycardic after 2L bolus, will give another 1L bolus. Records obtained from [facility], and her CT abd/pelv w/contrast from 06/27/17 states "1. There is focal wall thickening in the distal gastric antrum and pylorus, likely indicative of distal gastritis. No fistula or surrounding mesenteric thickening evident in this area. No other bowel wall thickening appreciable. No bowel obstruction. Terminal ileum appears normal. 2. Appendix appears normal. No abscess. 3. No renal or ureteral calculus. No hydronephrosis. 4. Gallbladder absent." and the CT abd/pelv w/contrast on 05/28/17 showed "1. Wall thickening in several loops of jejunum. Suspect a degree of enteritis. No bowel obstruction evident. 2. No demonstrable abscess. Appendix appears normal. 3. No renal or ureteral calculus. No hydronephrosis. 4. Small nodular lesions in the lung bases, almost certainly benign in this age group. 5. Apparent dominant follicle left ovary. This finding is felt to be a physiologic finding." These both confirm that her symptoms are likely related more to gastritis vs gastroenteritis; could also be cyclic vomiting syndrome, added on UDS which showed +THC which is likely contributing to her symptoms. Pt now resting comfortably and feels improved, will PO challenge. She is very anxious about being discharged, repeatedly asking if we can consult GI now so she can be seen sooner; after long discussion regarding how the process goes and what alternatives we have, she is comfortable with the idea of being referred to [place] up here in order to see if possibly she'd be able to get a sooner appt than she was able to get with the GI specialist in [place]  (Dr. Lyndel Safe). Will see how she does with PO challenge and reassess shortly.   4:16 PM Pt tolerating PO, symptoms improved. Discussed diet/lifestyle modifications for symptoms, BRAT diet and OTC remedies for diarrhea, continuation of bentyl, THC cessation, will start on zantac/zofran to use in addition to her home medications, advised tylenol and avoidance/sparing use of NSAIDs only on full stomach, discussed other OTC remedies for symptomatic relief, and f/up with PCP in 5-7 days for recheck of symptoms and ongoing evaluation/management, as well as with her  GI specialist next month. I explained the diagnosis and have given explicit precautions to return to the ER including for any other new or worsening symptoms. The patient understands and accepts the medical plan as it's been dictated and I have answered their questions. Discharge instructions concerning home care and prescriptions have been given. The patient is STABLE and is discharged to home in good condition.      Final Clinical Impressions(s) / ED Diagnoses   Final diagnoses:  Chronic abdominal pain  Gastroenteritis  Other chronic gastritis, presence of bleeding unspecified  Nausea vomiting and diarrhea  Dehydration  Marijuana use  Cyclical vomiting with nausea, intractability of vomiting not specified    New Prescriptions New Prescriptions   ONDANSETRON (ZOFRAN ODT) 4 MG DISINTEGRATING TABLET    Take 1 tablet (4 mg total) by mouth every 8 (eight) hours as needed for nausea or vomiting.   RANITIDINE (ZANTAC) 150 MG TABLET    Take 1 tablet (150 mg total) by mouth 2 (two) times daily.     [address], [place], [place] 07/03/17 1616    Lajean Saver, MD 07/04/17 [phone]

## 2017-07-10 HISTORY — PX: SMALL BOWEL ENTEROSCOPY: SHX2415

## 2017-07-14 ENCOUNTER — Ambulatory Visit: Payer: BC Managed Care – PPO | Admitting: Nurse Practitioner

## 2017-09-09 NOTE — L&D Delivery Note (Signed)
Delivery Note Pt progressed to C/C?+3 - pushed with 4.5 ctx.  At 7:35 PM a viable and healthy female was delivered via Vaginal, Spontaneous (Presentation ROA; ROT ).  APGAR: 8, 9; weight P .   Placenta status: delivered, intact .  Cord: 3V with the following complications: nuchal and body nuchal.   Anesthesia:  epidural Episiotomy: None Lacerations: Labial, B - R hemostatic; 2nd degree perineal; periclitoral, skin split on Left Suture Repair: 3.0 vicryl rapide Est. Blood Loss (mL): 134cc  Mom to postpartum.  Baby to Couplet care / Skin to Skin.  Kysa Calais Bovard-Stuckert 08/13/2018, 8:00 PM  B+/RI/Tdap in PNC/ Contra ?/Br

## 2017-10-09 ENCOUNTER — Encounter: Payer: Self-pay | Admitting: Gastroenterology

## 2017-10-09 HISTORY — PX: COLONOSCOPY: SHX174

## 2017-12-11 ENCOUNTER — Telehealth: Payer: Self-pay | Admitting: Gastroenterology

## 2017-12-11 NOTE — Telephone Encounter (Signed)
Tell her congrats from my side. All are safe but like to use as less meds as possible in pregnancy. Can go off bentyl slowly Then off omeprazole  Can continue zantac  Good news

## 2017-12-11 NOTE — Telephone Encounter (Signed)
Do you want to advise or should I have her contact her OB doctor?  Thank you.

## 2017-12-12 ENCOUNTER — Other Ambulatory Visit: Payer: Self-pay

## 2017-12-12 NOTE — Telephone Encounter (Signed)
Lmtcb 4/5

## 2017-12-12 NOTE — Telephone Encounter (Signed)
Patient called back, she has already cut back on her Bentyl from TID to daily.  She will taper off 1 QOD for a week and then stop.  Her OB said she could stay on the Omeprazole and Zantac.  Just FYI.

## 2017-12-16 NOTE — Telephone Encounter (Signed)
thanks

## 2017-12-29 NOTE — Telephone Encounter (Signed)
Patient called back in wanting to know if there is another medication that she can take that is an alternative to bentyl that is safe to take while she is pregnant.

## 2017-12-29 NOTE — Telephone Encounter (Signed)
Patient was notified by phone.

## 2017-12-29 NOTE — Telephone Encounter (Signed)
Please advise. Thank you

## 2017-12-29 NOTE — Telephone Encounter (Signed)
Please let Natalie Holloway know- that she can take Bentyl on an as-needed basis. It is safe during pregnancy. Maximum 2/day. Please inform her that we, in pregnancy, reduce any and almost all medicines to the least effective doses.

## 2018-01-15 LAB — OB RESULTS CONSOLE HIV ANTIBODY (ROUTINE TESTING): HIV: NONREACTIVE

## 2018-01-15 LAB — OB RESULTS CONSOLE ABO/RH: RH TYPE: POSITIVE

## 2018-01-15 LAB — OB RESULTS CONSOLE GC/CHLAMYDIA
Chlamydia: NEGATIVE
Gonorrhea: NEGATIVE

## 2018-01-15 LAB — OB RESULTS CONSOLE RPR: RPR: NONREACTIVE

## 2018-01-15 LAB — OB RESULTS CONSOLE RUBELLA ANTIBODY, IGM: RUBELLA: IMMUNE

## 2018-01-15 LAB — OB RESULTS CONSOLE HEPATITIS B SURFACE ANTIGEN: Hepatitis B Surface Ag: NEGATIVE

## 2018-01-15 LAB — OB RESULTS CONSOLE ANTIBODY SCREEN: ANTIBODY SCREEN: NEGATIVE

## 2018-01-26 ENCOUNTER — Telehealth: Payer: Self-pay | Admitting: Gastroenterology

## 2018-01-27 ENCOUNTER — Telehealth: Payer: Self-pay | Admitting: Gastroenterology

## 2018-01-27 MED ORDER — OMEPRAZOLE 20 MG PO CPDR: 20.0000 mg | DELAYED_RELEASE_CAPSULE | Freq: Every day | ORAL | 3 refills | Status: DC

## 2018-01-27 NOTE — Telephone Encounter (Signed)
Pls send refill for omeprazole to [facility] at [address] in [place]. It was sent to the wrong pharmacy. Thank you.

## 2018-01-27 NOTE — Telephone Encounter (Signed)
Sent refills to patients pharmacy.

## 2018-03-06 ENCOUNTER — Telehealth: Payer: Self-pay

## 2018-03-06 MED ORDER — RANITIDINE HCL 150 MG PO TABS: 150.0000 mg | ORAL_TABLET | Freq: Two times a day (BID) | ORAL | 3 refills | Status: DC

## 2018-03-06 NOTE — Telephone Encounter (Signed)
Sent refills to patients pharmacy.

## 2018-07-22 LAB — OB RESULTS CONSOLE GBS: STREP GROUP B AG: POSITIVE

## 2018-08-04 ENCOUNTER — Telehealth (HOSPITAL_COMMUNITY): Payer: Self-pay | Admitting: *Deleted

## 2018-08-04 ENCOUNTER — Encounter (HOSPITAL_COMMUNITY): Payer: Self-pay | Admitting: *Deleted

## 2018-08-04 NOTE — Telephone Encounter (Signed)
Preadmission screen  

## 2018-08-13 ENCOUNTER — Other Ambulatory Visit: Payer: Self-pay

## 2018-08-13 ENCOUNTER — Inpatient Hospital Stay (HOSPITAL_COMMUNITY): Payer: BC Managed Care – PPO | Admitting: Anesthesiology

## 2018-08-13 ENCOUNTER — Encounter (HOSPITAL_COMMUNITY): Payer: Self-pay

## 2018-08-13 ENCOUNTER — Inpatient Hospital Stay (HOSPITAL_COMMUNITY)
Admission: AD | Admit: 2018-08-13 | Discharge: 2018-08-15 | DRG: 807 | Disposition: A | Discharge status: Home / Self Care | Payer: BC Managed Care – PPO | Attending: Obstetrics and Gynecology | Admitting: Obstetrics and Gynecology

## 2018-08-13 DIAGNOSIS — O9902 Anemia complicating childbirth: Secondary | ICD-10-CM | POA: Diagnosis present

## 2018-08-13 DIAGNOSIS — O9962 Diseases of the digestive system complicating childbirth: Secondary | ICD-10-CM | POA: Diagnosis present

## 2018-08-13 DIAGNOSIS — O99824 Streptococcus B carrier state complicating childbirth: Secondary | ICD-10-CM | POA: Diagnosis present

## 2018-08-13 DIAGNOSIS — K219 Gastro-esophageal reflux disease without esophagitis: Secondary | ICD-10-CM | POA: Diagnosis present

## 2018-08-13 DIAGNOSIS — D649 Anemia, unspecified: Secondary | ICD-10-CM | POA: Diagnosis present

## 2018-08-13 DIAGNOSIS — Z3A39 39 weeks gestation of pregnancy: Secondary | ICD-10-CM | POA: Diagnosis not present

## 2018-08-13 DIAGNOSIS — Z3483 Encounter for supervision of other normal pregnancy, third trimester: Secondary | ICD-10-CM | POA: Diagnosis present

## 2018-08-13 LAB — COMPREHENSIVE METABOLIC PANEL
ALT: 10 U/L (ref 0–44)
AST: 16 U/L (ref 15–41)
Albumin: 3 g/dL — ABNORMAL LOW (ref 3.5–5.0)
Alkaline Phosphatase: 181 U/L — ABNORMAL HIGH (ref 38–126)
Anion gap: 11 (ref 5–15)
BUN: 8 mg/dL (ref 6–20)
CO2: 20 mmol/L — ABNORMAL LOW (ref 22–32)
Calcium: 8.9 mg/dL (ref 8.9–10.3)
Chloride: 103 mmol/L (ref 98–111)
Creatinine, Ser: 0.55 mg/dL (ref 0.44–1.00)
GFR calc Af Amer: >60 mL/min (ref >60)
GFR calc non Af Amer: >60 mL/min (ref >60)
Glucose, Bld: 98 mg/dL (ref 70–99)
Potassium: 4.3 mmol/L (ref 3.5–5.1)
Sodium: 134 mmol/L — ABNORMAL LOW (ref 135–145)
Total Bilirubin: 0.3 mg/dL (ref 0.3–1.2)
Total Protein: 7.4 g/dL (ref 6.5–8.1)

## 2018-08-13 LAB — URINALYSIS, ROUTINE W REFLEX MICROSCOPIC
Bilirubin Urine: NEGATIVE
Glucose, UA: NEGATIVE mg/dL
HGB URINE DIPSTICK: NEGATIVE
Ketones, ur: NEGATIVE mg/dL
NITRITE: NEGATIVE
Protein, ur: NEGATIVE mg/dL
Specific Gravity, Urine: 1.019 (ref 1.005–1.030)
pH: 6 (ref 5.0–8.0)

## 2018-08-13 LAB — CBC
HEMATOCRIT: 31.7 % — AB (ref 36.0–46.0)
Hemoglobin: 9.8 g/dL — ABNORMAL LOW (ref 12.0–15.0)
MCH: 23 pg — ABNORMAL LOW (ref 26.0–34.0)
MCHC: 30.9 g/dL (ref 30.0–36.0)
MCV: 74.2 fL — AB (ref 80.0–100.0)
Platelets: 264 10*3/uL (ref 150–400)
RBC: 4.27 MIL/uL (ref 3.87–5.11)
RDW: 14.9 % (ref 11.5–15.5)
WBC: 17.5 10*3/uL — ABNORMAL HIGH (ref 4.0–10.5)
nRBC: 0 % (ref 0.0–0.2)

## 2018-08-13 LAB — RPR: RPR Ser Ql: NONREACTIVE

## 2018-08-13 LAB — ABO/RH: ABO/RH(D): B POS

## 2018-08-13 LAB — TYPE AND SCREEN
ABO/RH(D): B POS
Antibody Screen: NEGATIVE

## 2018-08-13 LAB — POCT FERN TEST: POCT Fern Test: POSITIVE

## 2018-08-13 LAB — PROTEIN / CREATININE RATIO, URINE
Creatinine, Urine: 177 mg/dL
Protein Creatinine Ratio: 0.24 mg/mg{Cre} — ABNORMAL HIGH (ref 0.00–0.15)
TOTAL PROTEIN, URINE: 43 mg/dL

## 2018-08-13 MED ORDER — PRENATAL MULTIVITAMIN CH
1.0000 | ORAL_TABLET | Freq: Every day | ORAL | Status: DC
  Administered 2018-08-14 – 2018-08-15 (×2): 1 via ORAL
  Filled 2018-08-13 (×2): qty 1

## 2018-08-13 MED ORDER — FLEET ENEMA 7-19 GM/118ML RE ENEM: 1.0000 | ENEMA | RECTAL | Status: DC | PRN

## 2018-08-13 MED ORDER — OXYCODONE HCL 5 MG PO TABS: 5.0000 mg | ORAL_TABLET | ORAL | Status: DC | PRN

## 2018-08-13 MED ORDER — EPHEDRINE 5 MG/ML INJ
10.0000 mg | INTRAVENOUS | Status: DC | PRN
  Filled 2018-08-13: qty 2

## 2018-08-13 MED ORDER — LACTATED RINGERS IV SOLN: 500.0000 mL | Freq: Once | INTRAVENOUS | Status: DC

## 2018-08-13 MED ORDER — SENNOSIDES-DOCUSATE SODIUM 8.6-50 MG PO TABS
2.0000 | ORAL_TABLET | ORAL | Status: DC
  Administered 2018-08-13 – 2018-08-14 (×2): 2 via ORAL
  Filled 2018-08-13 (×2): qty 2

## 2018-08-13 MED ORDER — ZOLPIDEM TARTRATE 5 MG PO TABS: 5.0000 mg | ORAL_TABLET | Freq: Every evening | ORAL | Status: DC | PRN

## 2018-08-13 MED ORDER — DIBUCAINE 1 % RE OINT: 1.0000 "application " | TOPICAL_OINTMENT | RECTAL | Status: DC | PRN

## 2018-08-13 MED ORDER — IBUPROFEN 600 MG PO TABS
600.0000 mg | ORAL_TABLET | Freq: Four times a day (QID) | ORAL | Status: DC
  Administered 2018-08-14 – 2018-08-15 (×6): 600 mg via ORAL
  Filled 2018-08-13 (×7): qty 1

## 2018-08-13 MED ORDER — ONDANSETRON HCL 4 MG/2ML IJ SOLN: 4.0000 mg | Freq: Four times a day (QID) | INTRAMUSCULAR | Status: DC | PRN

## 2018-08-13 MED ORDER — PANTOPRAZOLE SODIUM 40 MG PO TBEC
40.0000 mg | DELAYED_RELEASE_TABLET | Freq: Every day | ORAL | Status: DC
  Administered 2018-08-14: 40 mg via ORAL
  Filled 2018-08-13 (×2): qty 1

## 2018-08-13 MED ORDER — ACETAMINOPHEN 325 MG PO TABS
650.0000 mg | ORAL_TABLET | ORAL | Status: DC | PRN
  Administered 2018-08-13: 650 mg via ORAL
  Filled 2018-08-13: qty 2

## 2018-08-13 MED ORDER — LACTATED RINGERS IV SOLN: INTRAVENOUS | Status: DC

## 2018-08-13 MED ORDER — OXYCODONE-ACETAMINOPHEN 5-325 MG PO TABS: 2.0000 | ORAL_TABLET | ORAL | Status: DC | PRN

## 2018-08-13 MED ORDER — OXYTOCIN 40 UNITS IN LACTATED RINGERS INFUSION - SIMPLE MED: 2.5000 [IU]/h | INTRAVENOUS | Status: DC

## 2018-08-13 MED ORDER — LACTATED RINGERS IV SOLN: 500.0000 mL | INTRAVENOUS | Status: DC | PRN

## 2018-08-13 MED ORDER — PHENYLEPHRINE 40 MCG/ML (10ML) SYRINGE FOR IV PUSH (FOR BLOOD PRESSURE SUPPORT)
80.0000 ug | PREFILLED_SYRINGE | INTRAVENOUS | Status: DC | PRN
  Filled 2018-08-13 (×2): qty 10

## 2018-08-13 MED ORDER — SIMETHICONE 80 MG PO CHEW: 80.0000 mg | CHEWABLE_TABLET | ORAL | Status: DC | PRN

## 2018-08-13 MED ORDER — LACTATED RINGERS IV SOLN
INTRAVENOUS | Status: DC
  Administered 2018-08-13 (×3): via INTRAVENOUS

## 2018-08-13 MED ORDER — BUTORPHANOL TARTRATE 1 MG/ML IJ SOLN
1.0000 mg | INTRAMUSCULAR | Status: DC | PRN
  Administered 2018-08-13: 1 mg via INTRAVENOUS
  Filled 2018-08-13: qty 1

## 2018-08-13 MED ORDER — COCONUT OIL OIL
1.0000 "application " | TOPICAL_OIL | Status: DC | PRN
  Administered 2018-08-14: 1 via TOPICAL
  Filled 2018-08-13: qty 120

## 2018-08-13 MED ORDER — DIPHENHYDRAMINE HCL 25 MG PO CAPS: 25.0000 mg | ORAL_CAPSULE | Freq: Four times a day (QID) | ORAL | Status: DC | PRN

## 2018-08-13 MED ORDER — PENICILLIN G 3 MILLION UNITS IVPB - SIMPLE MED
3.0000 10*6.[IU] | INTRAVENOUS | Status: DC
  Administered 2018-08-13 (×2): 3 10*6.[IU] via INTRAVENOUS
  Filled 2018-08-13 (×4): qty 100

## 2018-08-13 MED ORDER — OXYTOCIN 40 UNITS IN LACTATED RINGERS INFUSION - SIMPLE MED
1.0000 m[IU]/min | INTRAVENOUS | Status: DC
  Administered 2018-08-13: 2 m[IU]/min via INTRAVENOUS
  Filled 2018-08-13: qty 1000

## 2018-08-13 MED ORDER — ONDANSETRON HCL 4 MG PO TABS: 4.0000 mg | ORAL_TABLET | ORAL | Status: DC | PRN

## 2018-08-13 MED ORDER — ONDANSETRON HCL 4 MG/2ML IJ SOLN: 4.0000 mg | INTRAMUSCULAR | Status: DC | PRN

## 2018-08-13 MED ORDER — ACETAMINOPHEN 325 MG PO TABS: 650.0000 mg | ORAL_TABLET | ORAL | Status: DC | PRN

## 2018-08-13 MED ORDER — SODIUM CHLORIDE 0.9 % IV SOLN
5.0000 10*6.[IU] | Freq: Once | INTRAVENOUS | Status: AC
  Administered 2018-08-13: 5 10*6.[IU] via INTRAVENOUS
  Filled 2018-08-13: qty 5

## 2018-08-13 MED ORDER — OXYTOCIN BOLUS FROM INFUSION
500.0000 mL | Freq: Once | INTRAVENOUS | Status: AC
  Administered 2018-08-13: 500 mL via INTRAVENOUS

## 2018-08-13 MED ORDER — DIPHENHYDRAMINE HCL 50 MG/ML IJ SOLN: 12.5000 mg | INTRAMUSCULAR | Status: DC | PRN

## 2018-08-13 MED ORDER — FENTANYL 2.5 MCG/ML BUPIVACAINE 1/10 % EPIDURAL INFUSION (WH - ANES)
14.0000 mL/h | INTRAMUSCULAR | Status: DC | PRN
  Administered 2018-08-13 (×2): 14 mL/h via EPIDURAL
  Filled 2018-08-13 (×2): qty 100

## 2018-08-13 MED ORDER — PHENYLEPHRINE 40 MCG/ML (10ML) SYRINGE FOR IV PUSH (FOR BLOOD PRESSURE SUPPORT)
80.0000 ug | PREFILLED_SYRINGE | INTRAVENOUS | Status: DC | PRN
  Administered 2018-08-13 (×2): 80 ug via INTRAVENOUS
  Filled 2018-08-13 (×2): qty 10

## 2018-08-13 MED ORDER — BENZOCAINE-MENTHOL 20-0.5 % EX AERO
1.0000 "application " | INHALATION_SPRAY | CUTANEOUS | Status: DC | PRN
  Administered 2018-08-15: 1 via TOPICAL
  Filled 2018-08-13: qty 56

## 2018-08-13 MED ORDER — LIDOCAINE HCL (PF) 1 % IJ SOLN
30.0000 mL | INTRAMUSCULAR | Status: DC | PRN
  Filled 2018-08-13: qty 30

## 2018-08-13 MED ORDER — SOD CITRATE-CITRIC ACID 500-334 MG/5ML PO SOLN: 30.0000 mL | ORAL | Status: DC | PRN

## 2018-08-13 MED ORDER — PROMETHAZINE HCL 25 MG/ML IJ SOLN
25.0000 mg | Freq: Once | INTRAMUSCULAR | Status: AC
  Administered 2018-08-13: 25 mg via INTRAVENOUS
  Filled 2018-08-13: qty 1

## 2018-08-13 MED ORDER — LIDOCAINE HCL (PF) 1 % IJ SOLN
INTRAMUSCULAR | Status: DC | PRN
  Administered 2018-08-13: 13 mL via EPIDURAL

## 2018-08-13 MED ORDER — WITCH HAZEL-GLYCERIN EX PADS: 1.0000 "application " | MEDICATED_PAD | CUTANEOUS | Status: DC | PRN

## 2018-08-13 MED ORDER — OXYCODONE-ACETAMINOPHEN 5-325 MG PO TABS: 1.0000 | ORAL_TABLET | ORAL | Status: DC | PRN

## 2018-08-13 MED ORDER — OXYCODONE HCL 5 MG PO TABS: 10.0000 mg | ORAL_TABLET | ORAL | Status: DC | PRN

## 2018-08-13 NOTE — Progress Notes (Signed)
Patient ID: Natalie Holloway, female   DOB: 02-13-1996, 22 y.o.   MRN: [id number]   No c/o's. Comfortable with epidural  AFVSS gen NAD FHTs 140-150's, mod var, + accels, category 1 toco 2-72mn  9/90/+1  Anticipate complete and pushing soon

## 2018-08-13 NOTE — H&P (Signed)
Natalie Holloway is a 22 y.o. female G1 at 39+ with SROM.  Pregnancy complicated by elevated LFTs.  Had normal panorama- low risk female.   Received flu and Tdap/  Nl glucola.    OB History    Gravida  1   Para      Term      Preterm      AB      Living        SAB      TAB      Ectopic      Multiple      Live Births            G1 present  Past Medical History:  Diagnosis Date  . Anxiety   . Crohn's disease ([place])   . Essential hypertension    resolved when discontinued OCP's  . GERD (gastroesophageal reflux disease)    Prilosec  . Ovarian cyst    no current problems as of 10/14/14   Past Surgical History:  Procedure Laterality Date  . CHOLECYSTECTOMY N/A 10/15/2014   Procedure: LAPAROSCOPIC CHOLECYSTECTOMY;  Surgeon: Coralie Keens, MD;  Location: [facility];  Service: General;  Laterality: N/A;  . COLONOSCOPY    . DRUG INDUCED ENDOSCOPY    . FRENULECTOMY, UPPER LABIAL     Family History: family history is not on file. Social History:  reports that she has never smoked. She has never used smokeless tobacco. She reports that she does not drink alcohol or use drugs. Meds Sertraline, PNV, prilosec All NKDA    Maternal Diabetes: No Genetic Screening: Normal Maternal Ultrasounds/Referrals: Normal Fetal Ultrasounds or other Referrals:  None Maternal Substance Abuse:  No Significant Maternal Medications:  None Significant Maternal Lab Results:  Lab values include: Group B Strep positive Other Comments:  None  Review of Systems  Constitutional: Negative.   HENT: Negative.   Eyes: Negative.   Respiratory: Negative.   Cardiovascular: Negative.   Gastrointestinal: Negative.   Genitourinary: Negative.   Musculoskeletal: Negative.   Skin: Negative.   Neurological: Negative.   Psychiatric/Behavioral: Negative.    Maternal Medical History:  Reason for admission: Rupture of membranes.   Contractions: Frequency: irregular.    Fetal activity: Perceived fetal  activity is normal.    Prenatal Complications - Diabetes: none.    Dilation: 3.5 Effacement (%): 70 Station: -2 Exam by:: Arts development officer Blood pressure (!) 139/103, pulse 100, temperature 98.1 F (36.7 C), temperature source Oral, resp. rate 18, height 5' 2"  (1.575 m), weight 78.9 kg. Maternal Exam:  Uterine Assessment: Contraction strength is moderate.  Contraction frequency is regular.   Abdomen: Patient reports no abdominal tenderness. Fundal height is appropriate for gestation.   Estimated fetal weight is 7-8#.   Fetal presentation: vertex  Introitus: Normal vulva. Normal vagina.    Physical Exam  Constitutional: She is oriented to person, place, and time. She appears well-developed and well-nourished.  HENT:  Head: Normocephalic and atraumatic.  Cardiovascular: Normal rate and regular rhythm.  Respiratory: Effort normal and breath sounds normal. No respiratory distress. She has no wheezes.  GI: Soft. Bowel sounds are normal. She exhibits no distension. There is no tenderness.  Musculoskeletal: Normal range of motion.  Neurological: She is alert and oriented to person, place, and time.  Skin: Skin is warm and dry.  Psychiatric: She has a normal mood and affect. Her behavior is normal.    Prenatal labs: ABO, Rh: --/--/B POS (12/05 0746) Antibody: NEG (12/05 0746) Rubella: Immune (05/09  0000) RPR: Nonreactive (05/09 0000)  HBsAg: Negative (05/09 0000)  HIV: Non-reactive (05/09 0000)  GBS: Positive (11/13 0000)   Flu vaccine, Tdap 05/19/18  Panorama low-risk female Essential panel neg (CF, SMA, Fragile X neg), Hgb 12.5/Plt 314/Ur Cx neg/Chl neg/GC neg/Varicella immune/nl Hgb electro/Pap WNL/glucola 122/nl anat  Assessment/Plan: 22yo G1P0 at 39+ w SROM Augment labor prn with pitocin Epidural or stadol prn Expect SVD gbbs + CMP and PCR pending  Natalie Holloway 08/13/2018, 9:28 AM

## 2018-08-13 NOTE — Anesthesia Procedure Notes (Signed)
Epidural Patient location during procedure: OB Start time: 08/13/2018 11:05 AM End time: 08/13/2018 11:19 AM  Staffing Anesthesiologist: Lynda Rainwater, MD Performed: anesthesiologist   Preanesthetic Checklist Completed: patient identified, site marked, surgical consent, pre-op evaluation, timeout performed, IV checked, risks and benefits discussed and monitors and equipment checked  Epidural Patient position: sitting Prep: ChloraPrep Patient monitoring: heart rate, cardiac monitor, continuous pulse ox and blood pressure Approach: midline Location: L2-L3 Injection technique: LOR saline  Needle:  Needle type: Tuohy  Needle gauge: 17 G Needle length: 9 cm Needle insertion depth: 5 cm Catheter type: closed end flexible Catheter size: 20 Guage Catheter at skin depth: 9 cm Test dose: negative  Assessment Events: blood not aspirated, injection not painful, no injection resistance, negative IV test and no paresthesia  Additional Notes Reason for block:procedure for pain

## 2018-08-13 NOTE — Progress Notes (Signed)
Patient ID: Natalie Holloway, female   DOB: 08-Mar-1996, 22 y.o.   MRN: [id number]   Comfortable with epidural  AFVSS gen NAD FHTs 140-150, mod var, + accels, category.  Had decel, BP related after epidural toco q 2-35mn  SVE 3.5/70/-2  IUPC placed w/o diff/comp  Start pitocin to augment.

## 2018-08-13 NOTE — Progress Notes (Deleted)
Patient ID: Natalie Holloway, female   DOB: [birth year], 22 y.o.   MRN: [id number]   Pt with increased pain and contractions despite indomethacin  AFVSS gen NAD, uncomfortable FHTs 130's mod var, + accels, category 1 toco q 2-3 min  SVE 6/90/BBOW  Will transfer to L&D Epidural prn

## 2018-08-13 NOTE — Anesthesia Preprocedure Evaluation (Signed)
Anesthesia Evaluation  Patient identified by MRN, date of birth, ID band Patient awake    Reviewed: Allergy & Precautions, NPO status , Patient's Chart, lab work & pertinent test results  Airway Mallampati: I  TM Distance: >3 FB Neck ROM: Full    Dental  (+) Teeth Intact, Dental Advisory Given   Pulmonary    breath sounds clear to auscultation       Cardiovascular hypertension,  Rhythm:Regular Rate:Normal  Denies htn.  States previous hx htn while on BCP.  None now    Neuro/Psych    GI/Hepatic   Endo/Other    Renal/GU      Musculoskeletal   Abdominal   Peds  Hematology   Anesthesia Other Findings   Reproductive/Obstetrics (+) Pregnancy                             Anesthesia Physical  Anesthesia Plan  ASA: II  Anesthesia Plan: Epidural   Post-op Pain Management:    Induction:   PONV Risk Score and Plan:   Airway Management Planned:   Additional Equipment:   Intra-op Plan:   Post-operative Plan:   Informed Consent: I have reviewed the patients History and Physical, chart, labs and discussed the procedure including the risks, benefits and alternatives for the proposed anesthesia with the patient or authorized representative who has indicated his/her understanding and acceptance.     Plan Discussed with: CRNA, Anesthesiologist and Surgeon  Anesthesia Plan Comments:         Anesthesia Quick Evaluation

## 2018-08-13 NOTE — Anesthesia Pain Management Evaluation Note (Signed)
  CRNA Pain Management Visit Note  Patient: Natalie Holloway, 22 y.o., female  "Hello I am a member of the anesthesia team at [facility]. We have an anesthesia team available at all times to provide care throughout the hospital, including epidural management and anesthesia for C-section. I don't know your plan for the delivery whether it a natural birth, water birth, IV sedation, nitrous supplementation, doula or epidural, but we want to meet your pain goals."   1.Was your pain managed to your expectations on prior hospitalizations?   Yes   2.What is your expectation for pain management during this hospitalization?     Epidural  3.How can we help you reach that goal? Epidural when appropriate  Record the patient's initial score and the patient's pain goal.   Pain: 4  Pain Goal: 4 [facility] wants you to be able to say your pain was always managed very well.  Bufford Spikes 08/13/2018

## 2018-08-13 NOTE — MAU Note (Signed)
Pt reports to MAU c/o possible SROM @0230  clear fluid. +FM. Ctx every 4-12 mins.

## 2018-08-14 ENCOUNTER — Encounter (HOSPITAL_COMMUNITY): Payer: Self-pay

## 2018-08-14 ENCOUNTER — Inpatient Hospital Stay (HOSPITAL_COMMUNITY)
Admission: RE | Admit: 2018-08-14 | Discharge: 2018-08-14 | Disposition: A | Discharge status: Home / Self Care | Payer: BC Managed Care – PPO | Source: Ambulatory Visit | Attending: Obstetrics and Gynecology | Admitting: Obstetrics and Gynecology

## 2018-08-14 LAB — CBC
HCT: 25.5 % — ABNORMAL LOW (ref 36.0–46.0)
Hemoglobin: 7.8 g/dL — ABNORMAL LOW (ref 12.0–15.0)
MCH: 22.6 pg — ABNORMAL LOW (ref 26.0–34.0)
MCHC: 30.6 g/dL (ref 30.0–36.0)
MCV: 73.9 fL — ABNORMAL LOW (ref 80.0–100.0)
PLATELETS: 220 10*3/uL (ref 150–400)
RBC: 3.45 MIL/uL — ABNORMAL LOW (ref 3.87–5.11)
RDW: 14.9 % (ref 11.5–15.5)
WBC: 20.9 10*3/uL — ABNORMAL HIGH (ref 4.0–10.5)
nRBC: 0 % (ref 0.0–0.2)

## 2018-08-14 MED ORDER — FERROUS SULFATE 325 (65 FE) MG PO TABS
325.0000 mg | ORAL_TABLET | Freq: Every day | ORAL | Status: DC
  Administered 2018-08-14: 325 mg via ORAL
  Filled 2018-08-14 (×2): qty 1

## 2018-08-14 NOTE — Anesthesia Postprocedure Evaluation (Signed)
Anesthesia Post Note  Patient: PRISTINE GLADHILL  Procedure(s) Performed: AN AD HOC LABOR EPIDURAL     Patient location during evaluation: Mother Baby Anesthesia Type: Epidural Level of consciousness: awake and alert Pain management: pain level controlled Vital Signs Assessment: post-procedure vital signs reviewed and stable Respiratory status: spontaneous breathing, nonlabored ventilation and respiratory function stable Cardiovascular status: stable Postop Assessment: no headache, no backache, epidural receding and patient able to bend at knees Anesthetic complications: no    Last Vitals:  Vitals:   08/14/18 0208 08/14/18 0530  BP: 125/87 113/89  Pulse: (!) 118 97  Resp: 18 18  Temp: 36.7 C 36.9 C  SpO2:      Last Pain:  Vitals:   08/14/18 0530  TempSrc: Oral  PainSc: 0-No pain   Pain Goal: Patients Stated Pain Goal: 0 (08/13/18 5694)               Rayvon Char

## 2018-08-14 NOTE — Lactation Note (Signed)
This note was copied from a baby's chart. Lactation Consultation Note Baby 64 hrs old. Unable to latch to breast w/o NS d/t flat nipple unable to sustain deep latch.  Mom has flat nipples firey red and bruised. Mom stated she latched w/NS at the first feeding.  Mom states nipples very sensitive now. Easy flow of colostrum. Spoon fed baby 2 ml colostrum. Newborn behavior, STS, I&O, supply and demand, milk storage, discussed. Breast massage occasionally while feeding encouraged. Assisted in football position. Positioning, support, safety and positioning discussed. Heard swallows when feeding.  Mom encouraged to feed baby 8-12 times/24 hours and with feeding cues.  Encouraged to call for assistance or questions. [facility] brochure given w/resources, support groups and [place] services.  Patient Name: Girl Mario Voong [id number] Date: 08/14/2018 Reason for consult: Initial assessment;1st time breastfeeding   Maternal Data Has patient been taught Hand Expression?: Yes Does the patient have breastfeeding experience prior to this delivery?: No  Feeding Feeding Type: Breast Fed  LATCH Score Latch: Repeated attempts needed to sustain latch, nipple held in mouth throughout feeding, stimulation needed to elicit sucking reflex.  Audible Swallowing: Spontaneous and intermittent  Type of Nipple: Flat  Comfort (Breast/Nipple): Filling, red/small blisters or bruises, mild/mod discomfort  Hold (Positioning): Assistance needed to correctly position infant at breast and maintain latch.  LATCH Score: 6  Interventions Interventions: Breast feeding basics reviewed;Support pillows;Assisted with latch;Position options;Skin to skin;Expressed milk;Breast massage;Hand express;Shells;Pre-pump if needed;Comfort gels;Hand pump;Breast compression;DEBP;Adjust position  Lactation Tools Discussed/Used Tools: Comfort gels;Nipple [place] Fuel;Shells;Pump Nipple shield size: 20 Shell Type: Inverted Breast pump type:  Double-Electric Breast Pump;Manual WIC Program: No Pump Review: Setup, frequency, and cleaning;Milk Storage Initiated by:: Allayne Stack RN IBCLC Date initiated:: 08/14/18   Consult Status Consult Status: Follow-up Date: 08/14/18 Follow-up type: In-patient    Raphael Espe, Elta Guadeloupe 08/14/2018, 4:06 AM

## 2018-08-14 NOTE — Progress Notes (Signed)
MOB was referred for history of anxiety. * Referral screened out by Clinical Social Worker because none of the following criteria appear to apply: ~ History of anxiety/depression during this pregnancy, or of post-partum depression following prior delivery. ~ Diagnosis of anxiety and/or depression within last 3 years OR * MOB's symptoms currently being treated with medication and/or therapy. Per OB records, patient has a prescription/taking Sertraline for anxiety.  Please contact the Clinical Social Worker if needs arise, by [facility] request, or if MOB scores greater than 9/yes to question 10 on Edinburgh Postpartum Depression Screen.  Abundio Miu, [place] Worker [facility] Cell#: [phone]

## 2018-08-14 NOTE — Progress Notes (Addendum)
Patient ID: Natalie Holloway, female   DOB: 06/12/1996, 22 y.o.   MRN: [id number] Pt doing well with no complaints. Pain well controlled. Lochia mild. Denies CP, HA, fever. Bonding well with baby. Ambulating and voiding well.  VSS ABD - FF and 2cm below umb EXT - no homans    20.9>7.8<220  A/P: PPD#1 s/p svd - stable          Anemia - start on iron supps         Routine pp care

## 2018-08-15 MED ORDER — IBUPROFEN 600 MG PO TABS: 600.0000 mg | ORAL_TABLET | Freq: Four times a day (QID) | ORAL | 1 refills | Status: DC | PRN

## 2018-08-15 NOTE — Discharge Summary (Signed)
OB Discharge Summary     Patient Name: Natalie Holloway DOB: December 21, 1995 MRN: [id number]  Date of admission: 08/13/2018 Delivering MD: Janyth Contes   Date of discharge: 08/15/2018  Admitting diagnosis: 39.4wks ctx, water broke Intrauterine pregnancy: [redacted]w[redacted]d    Secondary diagnosis:  Principal Problem:   SVD (spontaneous vaginal delivery) Active Problems:   Indication for care in labor or delivery  Additional problems: none     Discharge diagnosis: Term Pregnancy Delivered                                                                                                Post partum procedures:noe  Augmentation: Pitocin  Complications: None  Hospital course:  Onset of Labor With Vaginal Delivery   22y.o. yo G1P1001 at 317w4das admitted to the hospital 08/13/2018 for latent labor. Patient had an uncomplicated labor course as follows:augmented with pitocin and delivered shortly afterwords Membrane Rupture Time/Date: 2:30 AM ,08/13/2018   Intrapartum Procedures: Episiotomy: None [1]                                         Lacerations:  Labial [10];2nd degree [3];Perineal [11]  Patient had delivery of a Viable infant.  Information for the patient's newborn:  SmFonda, Rochon0[[id number]]Delivery Method: Vaginal, Spontaneous(Filed from Delivery Summary)   08/13/2018  Details of delivery can be found in separate delivery note.  Patient had a routine postpartum course. Patient is discharged home 08/15/18.  Physical exam  Vitals:   08/14/18 0950 08/14/18 1337 08/14/18 2206 08/15/18 0553  BP: (!) 122/91 128/88 118/89 (!) 118/91  Pulse: 91 (!) 111 98 91  Resp: 18 18 16 18   Temp:  98 F (36.7 C) 97.9 F (36.6 C) 97.9 F (36.6 C)  TempSrc:   Oral Oral  SpO2: 99%     Weight:      Height:       General: alert, cooperative and no distress Lochia: appropriate Uterine Fundus: firm Incision: N/A DVT Evaluation: No evidence of DVT seen on physical exam. Labs: Lab Results   Component Value Date   WBC 20.9 (H) 08/14/2018   HGB 7.8 (L) 08/14/2018   HCT 25.5 (L) 08/14/2018   MCV 73.9 (L) 08/14/2018   PLT 220 08/14/2018   CMP Latest Ref Rng & Units 08/13/2018  Glucose 70 - 99 mg/dL 98  BUN 6 - 20 mg/dL 8  Creatinine 0.44 - 1.00 mg/dL 0.55  Sodium 135 - 145 mmol/L 134(L)  Potassium 3.5 - 5.1 mmol/L 4.3  Chloride 98 - 111 mmol/L 103  CO2 22 - 32 mmol/L 20(L)  Calcium 8.9 - 10.3 mg/dL 8.9  Total Protein 6.5 - 8.1 g/dL 7.4  Total Bilirubin 0.3 - 1.2 mg/dL 0.3  Alkaline Phos 38 - 126 U/L 181(H)  AST 15 - 41 U/L 16  ALT 0 - 44 U/L 10    Discharge instruction: per After Visit Summary and "Baby and Me Booklet".  After visit meds:  Allergies  as of 08/15/2018   No Known Allergies     Medication List    TAKE these medications   ibuprofen 600 MG tablet Commonly known as:  ADVIL,MOTRIN Take 1 tablet (600 mg total) by mouth every 6 (six) hours as needed for moderate pain or cramping.   multivitamin-prenatal 27-0.8 MG Tabs tablet Take 1 tablet by mouth daily at 12 noon.   omeprazole 20 MG capsule Commonly known as:  PRILOSEC Take 1 capsule (20 mg total) by mouth daily. What changed:  when to take this   ranitidine 150 MG tablet Commonly known as:  ZANTAC Take 1 tablet (150 mg total) by mouth 2 (two) times daily. What changed:  when to take this       Diet: routine diet  Activity: Advance as tolerated. Pelvic rest for 6 weeks.   Outpatient follow up:6 weeks Follow up Appt:No future appointments. Follow up Visit:No follow-ups on file.  Postpartum contraception: Not Discussed  Newborn Data: Live born female  Birth Weight: 6 lb 11.2 oz (3039 g) APGAR: 8, 9  Newborn Delivery   Birth date/time:  08/13/2018 19:35:00 Delivery type:  Vaginal, Spontaneous     Baby Feeding: Breast Disposition:home with mother   08/15/2018 Isaiah Serge, DO

## 2018-08-15 NOTE — Progress Notes (Signed)
Patient ID: Natalie Holloway, female   DOB: 01-Apr-1996, 22 y.o.   MRN: [id number] Pt doing well. Reports pain still well controlled and lochia mild. Denies CP, SOB or fever. She is ready for discharge to home today VSS ABD - FF EXT - no homans  A/P: PPD#2 s/p svd - stable         Discharge to home today         Reviewed instructions

## 2018-08-15 NOTE — Lactation Note (Signed)
This note was copied from a baby's chart. Lactation Consultation Note  Patient Name: Natalie Holloway [id number] Date: 08/15/2018 Reason for consult: Follow-up assessment;Term;1st time breastfeeding;Primapara  p1 mother whose infant is now 60 hours old.    Baby was latched to the right breast in the football hold when I arrived.  She was latched properly but not sucking.  Mother stated she was starting to get fussy so she latched her.  However, it does not appear like she is interested in feeding right now.  She did pass some gas and is now content.  I demonstrated gentle stimulation and mother was feeding STS.  Baby was still not interested.  I suggested mother take her off the breast and hold her STS until she shows readiness to feed.  Mother stated that she usually feeds well and that there is colostrum in the NS after feeding.  She also hears baby swallowing.    Encouraged to continue feeding 8-12 times/24 hours of sooner if baby shows cues.  Discussed the use of the nipple shield.  Mother has breast shells and a manual pump at bedside.  Encouraged to continue using these tools to help evert nipples.  She also has lanolin and coconut oil.  I discouraged the use of lanolin and suggested EBM and coconut oil instead with an explanation as to why these are preferred.  Comfort gels are at her bedside also.    Mother has our OP phone number for questions/concerns after discharge.  She has a DEBP for home use.  Father present.  RN updated.   Maternal Data Formula Feeding for Exclusion: No Has patient been taught Hand Expression?: Yes Does the patient have breastfeeding experience prior to this delivery?: No  Feeding Feeding Type: Breast Fed  LATCH Score Latch: Too sleepy or reluctant, no latch achieved, no sucking elicited.  Audible Swallowing: None  Type of Nipple: Everted at rest and after stimulation(short shafted)  Comfort (Breast/Nipple): Soft / non-tender  Hold (Positioning):  Assistance needed to correctly position infant at breast and maintain latch.  LATCH Score: 5  Interventions Interventions: Breast feeding basics reviewed;Skin to skin;Breast massage;Pre-pump if needed;Breast compression;Adjust position;Hand pump;Comfort gels;Shells;Coconut oil;Position options;Support pillows  Lactation Tools Discussed/Used Tools: Shells;Coconut oil;Comfort gels;Nipple Shields Nipple shield size: 20 Shell Type: Inverted Breast pump type: Manual WIC Program: No   Consult Status Consult Status: Complete Date: 08/15/18 Follow-up type: Call as needed    Dailin Sosnowski R Diangelo Radel 08/15/2018, 9:23 AM

## 2018-08-15 NOTE — Discharge Instructions (Signed)
Nothing in vagina for 6 weeks.  No sex, tampons, and douching.  Other instructions as in [employer].

## 2018-12-15 ENCOUNTER — Telehealth: Payer: Self-pay | Admitting: *Deleted

## 2018-12-15 MED ORDER — FAMOTIDINE 20 MG PO TABS: 20.0000 mg | ORAL_TABLET | Freq: Every day | ORAL | 6 refills | Status: AC

## 2018-12-15 NOTE — Telephone Encounter (Signed)
Pharmacy states that Zantac is on recall and wants to know if you will prescribe Famotidine 20 mg ?

## 2018-12-15 NOTE — Telephone Encounter (Signed)
Lets do famotidine 20 mg p.o. nightly, 30 with 6 refills

## 2018-12-15 NOTE — Telephone Encounter (Signed)
Pepcid sent to pharmacy

## 2019-01-11 ENCOUNTER — Other Ambulatory Visit: Payer: Self-pay | Admitting: Gastroenterology

## 2019-04-26 ENCOUNTER — Telehealth: Payer: Self-pay | Admitting: Gastroenterology

## 2019-04-27 ENCOUNTER — Telehealth: Payer: Self-pay

## 2019-04-27 ENCOUNTER — Other Ambulatory Visit: Payer: Self-pay

## 2019-04-27 ENCOUNTER — Other Ambulatory Visit (INDEPENDENT_AMBULATORY_CARE_PROVIDER_SITE_OTHER): Payer: BC Managed Care – PPO

## 2019-04-27 ENCOUNTER — Ambulatory Visit: Payer: BC Managed Care – PPO | Admitting: Gastroenterology

## 2019-04-27 VITALS — BP 106/76 | HR 110 | Temp 98.5°F | Ht 62.0 in | Wt 144.2 lb

## 2019-04-27 DIAGNOSIS — R1032 Left lower quadrant pain: Secondary | ICD-10-CM

## 2019-04-27 DIAGNOSIS — R1013 Epigastric pain: Secondary | ICD-10-CM | POA: Diagnosis not present

## 2019-04-27 DIAGNOSIS — R112 Nausea with vomiting, unspecified: Secondary | ICD-10-CM

## 2019-04-27 LAB — COMPREHENSIVE METABOLIC PANEL
ALT: 25 U/L (ref 0–35)
AST: 20 U/L (ref 0–37)
Albumin: 4.7 g/dL (ref 3.5–5.2)
Alkaline Phosphatase: 68 U/L (ref 39–117)
BUN: 13 mg/dL (ref 6–23)
CO2: 28 mEq/L (ref 19–32)
Calcium: 9.9 mg/dL (ref 8.4–10.5)
Chloride: 99 mEq/L (ref 96–112)
Creatinine, Ser: 0.72 mg/dL (ref 0.40–1.20)
GFR: 98.58 mL/min (ref >60.00)
Glucose, Bld: 100 mg/dL — ABNORMAL HIGH (ref 70–99)
Potassium: 3.2 mEq/L — ABNORMAL LOW (ref 3.5–5.1)
Sodium: 137 mEq/L (ref 135–145)
Total Bilirubin: 0.5 mg/dL (ref 0.2–1.2)
Total Protein: 8.2 g/dL (ref 6.0–8.3)

## 2019-04-27 MED ORDER — PROMETHAZINE HCL 25 MG RE SUPP: 25.0000 mg | Freq: Four times a day (QID) | RECTAL | 2 refills | Status: DC | PRN

## 2019-04-27 MED ORDER — PANTOPRAZOLE SODIUM 40 MG PO TBEC: 40.0000 mg | DELAYED_RELEASE_TABLET | Freq: Every day | ORAL | 6 refills | Status: AC

## 2019-04-27 MED ORDER — DICYCLOMINE HCL 10 MG PO CAPS: 10.0000 mg | ORAL_CAPSULE | Freq: Three times a day (TID) | ORAL | 6 refills | Status: DC

## 2019-04-27 MED ORDER — AMBULATORY NON FORMULARY MEDICATION: 0 refills | Status: AC

## 2019-04-27 NOTE — Patient Instructions (Signed)
If you are age 25 or older, your body mass index should be between 23-30. Your Body mass index is 26.38 kg/m. If this is out of the aforementioned range listed, please consider follow up with your Primary Care Provider.  If you are age 67 or younger, your body mass index should be between 19-25. Your Body mass index is 26.38 kg/m. If this is out of the aformentioned range listed, please consider follow up with your Primary Care Provider.   We have sent the following medications to your pharmacy for you to pick up at your convenience: Phenergan Protonix GI Cocktail Bentyl  You have been scheduled [facility] CT [facility] the [facility] [place] [address] [address]).   You are scheduled on 05/01/19 at [facility] should arrive 15 minutes prior to your appointment time for registration. Please follow the written instructions below on the day of your exam:  WARNING: IF YOU ARE ALLERGIC TO IODINE/X-RAY DYE, PLEASE NOTIFY RADIOLOGY IMMEDIATELY AT [phone]! YOU WILL BE GIVEN A 13 HOUR PREMEDICATION PREP.  1) Do not eat or drink anything after 4am (4 hours prior to your test) 2) You have been given 2 bottles of oral contrast to drink. The solution may taste better if refrigerated, but do NOT add ice or any other liquid to this solution. Shake well before drinking.    Drink 1 bottle of contrast @ 6am (2 hours prior to your exam)  Drink 1 bottle of contrast @ 7am (1 hour prior to your exam)  You may take any medications as prescribed with a small amount of water, if necessary. If you take any of the following medications: METFORMIN, GLUCOPHAGE, GLUCOVANCE, AVANDAMET, RIOMET, FORTAMET, [place] MET, JANUMET, GLUMETZA or METAGLIP, you MAY be asked to HOLD this medication 48 hours AFTER the exam.  The purpose of you drinking the oral contrast is to aid in the visualization of your intestinal tract. The contrast solution may cause some  diarrhea. Depending on your individual set of symptoms, you may also receive an intravenous injection of x-ray contrast/dye. Plan on being at [facility] for 30 minutes or longer, depending on the type of exam you are having performed.  This test typically takes 30-45 minutes to complete.  If you have any questions regarding your exam or if you need to reschedule, you may call the CT department at [phone] between the hours of 8:00 am and 5:00 pm, Monday-Friday.  ________________________________________________________________________  Please go to the lab at [facility] ([place] You will need to go to level "B", you do not need an appointment for this. Hours available are 7:30 am - 4:30 pm.    Thank you,  Dr. Jackquline Denmark

## 2019-04-27 NOTE — Telephone Encounter (Signed)
Covid-19 screening questions   Do you now or have you had a fever in the last 14 days? No  Do you have any respiratory symptoms of shortness of breath or cough now or in the last 14 days? No  Do you have any family members or close contacts with diagnosed or suspected Covid-19 in the past 14 days? Said she was around a coworker but she tested negative  Have you been tested for Covid-19 and found to be positive? No

## 2019-04-27 NOTE — Progress Notes (Signed)
Chief Complaint: FU  Referring Provider:  No ref. provider found      ASSESSMENT AND PLAN;   #1. Epigastic pain/N/V with neg enteroscopy 07/2017.  Has associated anxiety.  May have some functional component.  #2. GERD  #3. LLQ pain with tenderness.  #4. Weight loss.  #5. IBS with diarrhea, element of postchole diarrhea.  No definite Crohn's.  Always has elevated WBC count.  Plan: - Phenergan 82m PR Q6hrs prn #20, 2 refills - Records regarding colonoscopy, blood tests. - Lab results please. - Protonix 43mpo qd #30, 11 refiils - GI cocktail.  Equal amounts of Maalox, viscous lidocaine, liquid Donnatal or Bentyl.  5 to 10 cc p.o. every 6-8 hours PRN.  120 mL - Dicyclomine 10 mg p.o. 4 times daily PRN. - CT AP with p.o. and IV contrast. - FU in 12 weeks.  If still with problems, proceed with endoscopic work-up.  I have reassured the patient as well.  HPI:    Natalie LANGTONs a [age].o. female  For follow-up visit Episodic N/V -attributed to panic attacks.  However, once it starts she has to go through several episodes of vomiting.  Denies having any melena or hematochezia.  No nonsteroidals.  She does have generalized abdominal pain particularly in the epigastric area and left lower quadrant.  Longstanding history of diarrhea, ever since she had lap chole (for biliary dyskinesia).  Has history of significant constipation prior to surgery.  Does admit that she has been under considerable stress over the last 3 months  Bentyl does work.  No definite Crohn's disease.  However, her colonoscopy report is awaited.  Wt loss- 15lb over   8 2 monthsld daughter.  She is a scEducation officer, [place]  Previous GI work-up: - CT 06/27/2017 gastric wall thickening, small hiatal hernia, fatty liver, thickened proximal jejunal loops without any obstruction. -Enteroscopy 07/2017-small hiatal hernia, mild gastritis otherwise normal.  Negative jejunal gastric biopsies.   And immediate post endoscopy.,  She had significant abdominal pain with retching requiring fentanyl, Zofran and Phenergan.  Acute abdominal series was negative.  She responded well to Phenergan. -Colonoscopy in the past (TLarena Glassmano get report) Past Medical History:  Diagnosis Date  . Anxiety   . Cro[place]'s disease ([place]  . Essential hypertension    resolved when discontinued OCP's  . GERD (gastroesophageal reflux disease)    Prilosec  . Ovarian cyst    no current problems as of 10/14/14  . SVD (spontaneous vaginal delivery) 08/13/2018    Past Surgical History:  Procedure Laterality Date  . CHOLECYSTECTOMY N/A 10/15/2014   Procedure: LAPAROSCOPIC CHOLECYSTECTOMY;  Surgeon: DoCoralie KeensMD;  [place] Service: General;  Laterality: N/A;  . COLONOSCOPY    . DRUG INDUCED ENDOSCOPY    . FRENULECTOMY, UPPER LABIAL    . SMALL BOWEL ENTEROSCOPY  07/10/2017   Small hiatal hernia. Mild gastritis. Otherwise normal enteroscopy     No family history on file.  Social History   Tobacco Use  . Smoking status: Never Smoker  . Smokeless tobacco: Never Used  Substance Use Topics  . Alcohol use: No  . Drug use: No    Current Outpatient Medications  Medication Sig Dispense Refill  . busPIRone (BUSPAR) 10 MG tablet Take 1 tablet by mouth 3 (three) times daily as needed.    . famotidine (PEPCID) 20 MG tablet Take 1 tablet (20 mg total) by mouth at bedtime. 30 tablet 6  .  omeprazole (PRILOSEC) 20 MG capsule TAKE 1 CAPSULE BY MOUTH EVERY DAY 30 capsule 3  . PARoxetine (PAXIL) 20 MG tablet Take 20 mg by mouth daily.     No current facility-administered medications for this visit.     No Known Allergies  Review of Systems:  Constitutional: Denies fever, chills, diaphoresis, appetite change and has fatigue.       Physical Exam:    Temp 98.5 F (36.9 C)   Ht 5' 2"  (1.575 m)   Wt 144 lb 4 oz (65.4 kg)   BMI 26.38 kg/m  Filed Weights   04/27/19 0947  Weight: 144 lb 4 oz (65.4  kg)   Constitutional:  Well-developed, in no acute distress. Psychiatric: Normal mood and affect. Behavior is normal. HEENT: Pupils normal.  Conjunctivae are normal. No scleral icterus.  Cardiovascular: Normal rate, regular rhythm. No edema Pulmonary/chest: Effort normal and breath sounds normal. No wheezing, rales or rhonchi. Abdominal: Soft, nondistended.  No abdominal tenderness bowel sounds active throughout. There are no masses palpable. No hepatomegaly. Rectal:  defered Neurological: Alert and oriented to person place and time. Skin: Skin is warm and dry. No rashes noted. Seen in presence of Natalie Holloway CMA 25 minutes spent with the patient today including coordination of care.       Carmell Austria, MD 04/27/2019, 10:10 AM  Cc: No ref. provider found

## 2019-04-28 ENCOUNTER — Ambulatory Visit (HOSPITAL_COMMUNITY)
Admission: RE | Admit: 2019-04-28 | Discharge: 2019-04-28 | Disposition: A | Discharge status: Home / Self Care | Payer: BC Managed Care – PPO | Attending: Psychiatry | Admitting: Psychiatry

## 2019-04-28 NOTE — H&P (Signed)
Natalie Holloway is an 23 y.o. female patient presents as walk in with husband.  Patient states that she has had worsening anxiety for the last several months.  States she has a prior history of anxiety but it has never been this bad.  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  "At school today my panic attack was so bad that I ended up in a ball on the floor, crying and couldn't breath.  I've always had some social anxiety; but not like this.  I can't even go to work."  Patient denies prior psychiatric history other than anxiety.  States that her primary care doctor started he on Paxil and Buspar but doesn't feel that they are working.  Patient states that she has also started to hear voices telling her that she is letting her family, school, and other down.  She denies suicidal/self-harm/homicidal ideation and paranoia.  Patient lives with her husband whom she says is very supportive and her 22 month old daughter.  Patient also states that her family is supportive and she feels safe at home.  Patient doesn't feel that she needs inpatient treatment but would like to get set up with outpatient services. Patient husband with patient and feels that the patient is safe to come home.  Total Time spent with patient: 45 minutes  Psychiatric Specialty Exam: Physical Exam  Constitutional: She is oriented to person, place, and time. She appears well-developed and well-nourished. No distress.  Neck: Normal range of motion.  Respiratory: Effort normal.  Musculoskeletal: Normal range of motion.  Neurological: She is alert and oriented to person, place, and time.  Skin: Skin is warm and dry.    ROS  Blood pressure (!) 151/113, pulse (!) 161, temperature 98.5 F (36.9 C), temperature source Oral, resp. rate 20, SpO2 100 %, unknown if currently  breastfeeding.There is no height or weight on file to calculate BMI.  General Appearance: Casual  Eye Contact:  Good  Speech:  Clear and Coherent and Normal Rate  Volume:  Normal  Mood:  Anxious and Depressed  Affect:  Appropriate, Congruent and Depressed  Thought Process:  Coherent, Goal Directed and Descriptions of Associations: Intact  Orientation:  Full (Time, Place, and Person)  Thought Content:  Hallucinations: Auditory  Suicidal Thoughts:  No  Homicidal Thoughts:  No  Memory:  Immediate;   Good Recent;   Good  Judgement:  Intact  Insight:  Present  Psychomotor Activity:  Normal  Concentration: Concentration: Good and Attention Span: Good  Recall:  Good  Fund of Knowledge:Good  Language: Good  Akathisia:  No  Handed:  Right  AIMS (if indicated):     Assets:  Communication Skills Desire for Improvement Housing Intimacy Physical Health Social Support Transportation  Sleep:       Musculoskeletal: Strength & Muscle Tone: within normal limits Gait & Station: normal Patient leans: N/A  Blood pressure (!) 151/113, pulse (!) 161, temperature 98.5 F (36.9 C), temperature source Oral, resp. rate 20, SpO2 100 %, unknown if currently breastfeeding.  Recommendations:  Outpatient psychiatric services:  Patient referred to [facility] Outpatient IOP program; information sent in; patient informed if no call by 10:00 am tomorrow to call office.  Would like patient to start ASAP.  Based on my evaluation the patient does not appear to have an  emergency medical condition.   Disposition: No evidence of imminent risk to self or others at present.   Supportive therapy provided about ongoing stressors. Discussed crisis plan, support from social network, calling 911, coming to the Emergency Department, and calling Suicide Hotline.  Shuvon Rankin, NP 04/28/2019, 6:41 PM

## 2019-04-29 ENCOUNTER — Telehealth: Payer: Self-pay | Admitting: Gastroenterology

## 2019-04-29 ENCOUNTER — Telehealth (HOSPITAL_COMMUNITY): Payer: Self-pay | Admitting: Psychiatry

## 2019-04-29 NOTE — Telephone Encounter (Signed)
I have returned patients call, left message for her to return my call.

## 2019-04-29 NOTE — Telephone Encounter (Signed)
D:  Natalie Rankin, NP referred pt to [facility].  A:  Placed call to orient pt and provide her with a start date.  Pt declined group, states she would like to just have individual counseling.  "I didn't want to tell them at the hospital; but that's all I want."  Writer was told that the outpt clinic doesn't have any openings; writer provided pt with several therapist's phone numbers.  R: Pt receptive.

## 2019-04-30 NOTE — Telephone Encounter (Signed)
I have returned patients call and spoke to  her husband, patients husband said they found the paper prescription that the patient was given. I advised them to take it to [facility] or [facility] to have medication filled.

## 2019-05-01 ENCOUNTER — Other Ambulatory Visit: Payer: Self-pay

## 2019-05-01 ENCOUNTER — Ambulatory Visit (HOSPITAL_BASED_OUTPATIENT_CLINIC_OR_DEPARTMENT_OTHER)
Admission: RE | Admit: 2019-05-01 | Discharge: 2019-05-01 | Disposition: A | Discharge status: Home / Self Care | Payer: BC Managed Care – PPO | Source: Ambulatory Visit | Attending: Gastroenterology | Admitting: Gastroenterology

## 2019-05-01 DIAGNOSIS — R112 Nausea with vomiting, unspecified: Secondary | ICD-10-CM | POA: Insufficient documentation

## 2019-05-01 DIAGNOSIS — R1032 Left lower quadrant pain: Secondary | ICD-10-CM | POA: Insufficient documentation

## 2019-05-01 DIAGNOSIS — R1013 Epigastric pain: Secondary | ICD-10-CM | POA: Diagnosis not present

## 2019-05-01 MED ORDER — IOHEXOL 300 MG/ML  SOLN
100.0000 mL | Freq: Once | INTRAMUSCULAR | Status: AC | PRN
  Administered 2019-05-01: 08:00:00 100 mL via INTRAVENOUS

## 2019-05-03 ENCOUNTER — Other Ambulatory Visit: Payer: Self-pay

## 2019-05-03 DIAGNOSIS — E876 Hypokalemia: Secondary | ICD-10-CM

## 2019-05-03 MED ORDER — POTASSIUM CHLORIDE CRYS ER 20 MEQ PO TBCR: 20.0000 meq | EXTENDED_RELEASE_TABLET | Freq: Every day | ORAL | 0 refills | Status: DC

## 2019-09-01 LAB — OB RESULTS CONSOLE GC/CHLAMYDIA
Chlamydia: NEGATIVE
Gonorrhea: NEGATIVE

## 2019-09-01 LAB — OB RESULTS CONSOLE RUBELLA ANTIBODY, IGM: Rubella: IMMUNE

## 2019-09-01 LAB — OB RESULTS CONSOLE HIV ANTIBODY (ROUTINE TESTING): HIV: NONREACTIVE

## 2019-09-01 LAB — OB RESULTS CONSOLE HEPATITIS B SURFACE ANTIGEN: Hepatitis B Surface Ag: NEGATIVE

## 2019-09-01 LAB — OB RESULTS CONSOLE ANTIBODY SCREEN: Antibody Screen: NEGATIVE

## 2019-09-01 LAB — OB RESULTS CONSOLE RPR: RPR: NONREACTIVE

## 2019-09-01 LAB — OB RESULTS CONSOLE ABO/RH: RH Type: POSITIVE

## 2019-09-10 NOTE — L&D Delivery Note (Signed)
Delivery Note Pt pushed twice with great descent. At 11:34 PM a viable female was delivered via Vaginal, Spontaneous (Presentation: Left Occiput Anterior).  APGAR: 9, 9; weight pending. Upon delivery of vertex, no nuchal was noted. Anterior and posterior shoulders delivered with no difficulty and body easily followed. Infant placed skin to skin on mum. Cord was clamped and cut after a minute delay by FOB. Cord blood was obtained then placenta delivered spontaneously.  Placenta status: Spontaneous, Intact. Schultz   Cord: 3 vessels with the following complications: None.  Cord pH: n/a3  Anesthesia: Epidural Episiotomy: None Lacerations: 1st degree;Perineal Suture Repair: 2.0 vicryl Est. Blood Loss (mL):  150  Mom to postpartum.  Baby to Couplet care / Skin to Skin  They would like baby to be circumcised.  Natalie Holloway 04/12/2020, 11:53 PM

## 2020-02-18 ENCOUNTER — Other Ambulatory Visit (HOSPITAL_COMMUNITY): Payer: Self-pay | Admitting: *Deleted

## 2020-02-21 NOTE — Discharge Instructions (Signed)

## 2020-02-22 ENCOUNTER — Encounter (HOSPITAL_COMMUNITY)
Admission: RE | Admit: 2020-02-22 | Discharge: 2020-02-22 | Disposition: A | Discharge status: Home / Self Care | Payer: BC Managed Care – PPO | Source: Ambulatory Visit | Attending: Obstetrics and Gynecology | Admitting: Obstetrics and Gynecology

## 2020-02-22 ENCOUNTER — Other Ambulatory Visit: Payer: Self-pay

## 2020-02-22 DIAGNOSIS — Z3A00 Weeks of gestation of pregnancy not specified: Secondary | ICD-10-CM | POA: Insufficient documentation

## 2020-02-22 DIAGNOSIS — D649 Anemia, unspecified: Secondary | ICD-10-CM | POA: Diagnosis not present

## 2020-02-22 DIAGNOSIS — O99019 Anemia complicating pregnancy, unspecified trimester: Secondary | ICD-10-CM | POA: Insufficient documentation

## 2020-02-22 MED ORDER — SODIUM CHLORIDE 0.9 % IV SOLN
510.0000 mg | INTRAVENOUS | Status: DC
  Administered 2020-02-22: 510 mg via INTRAVENOUS
  Filled 2020-02-22: qty 17

## 2020-02-29 ENCOUNTER — Other Ambulatory Visit: Payer: Self-pay

## 2020-02-29 ENCOUNTER — Encounter (HOSPITAL_COMMUNITY)
Admission: RE | Admit: 2020-02-29 | Discharge: 2020-02-29 | Disposition: A | Discharge status: Home / Self Care | Payer: BC Managed Care – PPO | Source: Ambulatory Visit | Attending: Obstetrics and Gynecology | Admitting: Obstetrics and Gynecology

## 2020-02-29 DIAGNOSIS — O99019 Anemia complicating pregnancy, unspecified trimester: Secondary | ICD-10-CM | POA: Diagnosis not present

## 2020-02-29 MED ORDER — SODIUM CHLORIDE 0.9 % IV SOLN
510.0000 mg | INTRAVENOUS | Status: DC
  Administered 2020-02-29: 510 mg via INTRAVENOUS
  Filled 2020-02-29: qty 17

## 2020-04-03 ENCOUNTER — Telehealth (HOSPITAL_COMMUNITY): Payer: Self-pay | Admitting: *Deleted

## 2020-04-03 ENCOUNTER — Encounter (HOSPITAL_COMMUNITY): Payer: Self-pay | Admitting: *Deleted

## 2020-04-03 NOTE — Telephone Encounter (Signed)
Preadmission screen  

## 2020-04-04 ENCOUNTER — Telehealth (HOSPITAL_COMMUNITY): Payer: Self-pay | Admitting: *Deleted

## 2020-04-04 ENCOUNTER — Encounter (HOSPITAL_COMMUNITY): Payer: Self-pay | Admitting: *Deleted

## 2020-04-04 NOTE — Telephone Encounter (Signed)
Preadmission screen  

## 2020-04-11 ENCOUNTER — Other Ambulatory Visit (HOSPITAL_COMMUNITY)
Admission: RE | Admit: 2020-04-11 | Discharge: 2020-04-11 | Disposition: A | Discharge status: Home / Self Care | Payer: BC Managed Care – PPO | Source: Ambulatory Visit | Attending: Obstetrics and Gynecology | Admitting: Obstetrics and Gynecology

## 2020-04-11 DIAGNOSIS — Z20822 Contact with and (suspected) exposure to covid-19: Secondary | ICD-10-CM | POA: Insufficient documentation

## 2020-04-11 DIAGNOSIS — Z01812 Encounter for preprocedural laboratory examination: Secondary | ICD-10-CM | POA: Insufficient documentation

## 2020-04-12 ENCOUNTER — Other Ambulatory Visit: Payer: Self-pay

## 2020-04-12 ENCOUNTER — Encounter (HOSPITAL_COMMUNITY): Payer: Self-pay | Admitting: Obstetrics and Gynecology

## 2020-04-12 ENCOUNTER — Inpatient Hospital Stay (HOSPITAL_COMMUNITY): Payer: BC Managed Care – PPO | Admitting: Anesthesiology

## 2020-04-12 ENCOUNTER — Inpatient Hospital Stay (HOSPITAL_COMMUNITY)
Admission: AD | Admit: 2020-04-12 | Discharge: 2020-04-14 | DRG: 806 | Disposition: A | Discharge status: Home / Self Care | Payer: BC Managed Care – PPO | Attending: Obstetrics and Gynecology | Admitting: Obstetrics and Gynecology

## 2020-04-12 DIAGNOSIS — Z20822 Contact with and (suspected) exposure to covid-19: Secondary | ICD-10-CM | POA: Diagnosis present

## 2020-04-12 DIAGNOSIS — O9962 Diseases of the digestive system complicating childbirth: Secondary | ICD-10-CM | POA: Diagnosis present

## 2020-04-12 DIAGNOSIS — K219 Gastro-esophageal reflux disease without esophagitis: Secondary | ICD-10-CM | POA: Diagnosis present

## 2020-04-12 DIAGNOSIS — F419 Anxiety disorder, unspecified: Secondary | ICD-10-CM | POA: Diagnosis present

## 2020-04-12 DIAGNOSIS — O99344 Other mental disorders complicating childbirth: Secondary | ICD-10-CM | POA: Diagnosis present

## 2020-04-12 DIAGNOSIS — O26893 Other specified pregnancy related conditions, third trimester: Secondary | ICD-10-CM | POA: Diagnosis present

## 2020-04-12 DIAGNOSIS — O99824 Streptococcus B carrier state complicating childbirth: Secondary | ICD-10-CM | POA: Diagnosis present

## 2020-04-12 DIAGNOSIS — K5090 Crohn's disease, unspecified, without complications: Secondary | ICD-10-CM | POA: Diagnosis present

## 2020-04-12 DIAGNOSIS — Z3A39 39 weeks gestation of pregnancy: Secondary | ICD-10-CM | POA: Diagnosis not present

## 2020-04-12 LAB — OB RESULTS CONSOLE GBS: GBS: POSITIVE

## 2020-04-12 LAB — CBC
HCT: 37.5 % (ref 36.0–46.0)
Hemoglobin: 12 g/dL (ref 12.0–15.0)
MCH: 26.1 pg (ref 26.0–34.0)
MCHC: 32 g/dL (ref 30.0–36.0)
MCV: 81.7 fL (ref 80.0–100.0)
Platelets: 198 10*3/uL (ref 150–400)
RBC: 4.59 MIL/uL (ref 3.87–5.11)
WBC: 12.7 10*3/uL — ABNORMAL HIGH (ref 4.0–10.5)
nRBC: 0 % (ref 0.0–0.2)

## 2020-04-12 LAB — TYPE AND SCREEN
ABO/RH(D): B POS
Antibody Screen: NEGATIVE

## 2020-04-12 LAB — POCT FERN TEST: POCT Fern Test: POSITIVE

## 2020-04-12 LAB — SARS CORONAVIRUS 2 (TAT 6-24 HRS): SARS Coronavirus 2: NEGATIVE

## 2020-04-12 MED ORDER — LACTATED RINGERS IV SOLN: INTRAVENOUS | Status: DC

## 2020-04-12 MED ORDER — FENTANYL-BUPIVACAINE-NACL 0.5-0.125-0.9 MG/250ML-% EP SOLN
EPIDURAL | Status: AC
  Filled 2020-04-12: qty 250

## 2020-04-12 MED ORDER — SODIUM CHLORIDE 0.9 % IV SOLN
5.0000 10*6.[IU] | Freq: Once | INTRAVENOUS | Status: AC
  Administered 2020-04-12: 5 10*6.[IU] via INTRAVENOUS
  Filled 2020-04-12: qty 5

## 2020-04-12 MED ORDER — SOD CITRATE-CITRIC ACID 500-334 MG/5ML PO SOLN: 30.0000 mL | ORAL | Status: DC | PRN

## 2020-04-12 MED ORDER — SODIUM CHLORIDE (PF) 0.9 % IJ SOLN
INTRAMUSCULAR | Status: DC | PRN
  Administered 2020-04-12: 12 mL/h via EPIDURAL

## 2020-04-12 MED ORDER — OXYCODONE-ACETAMINOPHEN 5-325 MG PO TABS: 1.0000 | ORAL_TABLET | ORAL | Status: DC | PRN

## 2020-04-12 MED ORDER — ONDANSETRON HCL 4 MG/2ML IJ SOLN
4.0000 mg | Freq: Four times a day (QID) | INTRAMUSCULAR | Status: DC | PRN
  Administered 2020-04-12: 4 mg via INTRAVENOUS
  Filled 2020-04-12: qty 2

## 2020-04-12 MED ORDER — ACETAMINOPHEN 325 MG PO TABS: 650.0000 mg | ORAL_TABLET | ORAL | Status: DC | PRN

## 2020-04-12 MED ORDER — LIDOCAINE HCL (PF) 1 % IJ SOLN
INTRAMUSCULAR | Status: DC | PRN
  Administered 2020-04-12: 5 mL via EPIDURAL

## 2020-04-12 MED ORDER — OXYTOCIN-SODIUM CHLORIDE 30-0.9 UT/500ML-% IV SOLN
1.0000 m[IU]/min | INTRAVENOUS | Status: DC
  Administered 2020-04-12: 2 m[IU]/min via INTRAVENOUS

## 2020-04-12 MED ORDER — TERBUTALINE SULFATE 1 MG/ML IJ SOLN: 0.2500 mg | Freq: Once | INTRAMUSCULAR | Status: DC | PRN

## 2020-04-12 MED ORDER — FLEET ENEMA 7-19 GM/118ML RE ENEM: 1.0000 | ENEMA | RECTAL | Status: DC | PRN

## 2020-04-12 MED ORDER — OXYTOCIN-SODIUM CHLORIDE 30-0.9 UT/500ML-% IV SOLN
2.5000 [IU]/h | INTRAVENOUS | Status: DC
  Filled 2020-04-12: qty 500

## 2020-04-12 MED ORDER — LIDOCAINE HCL (PF) 1 % IJ SOLN: 30.0000 mL | INTRAMUSCULAR | Status: DC | PRN

## 2020-04-12 MED ORDER — LACTATED RINGERS IV SOLN: 500.0000 mL | INTRAVENOUS | Status: DC | PRN

## 2020-04-12 MED ORDER — OXYCODONE-ACETAMINOPHEN 5-325 MG PO TABS: 2.0000 | ORAL_TABLET | ORAL | Status: DC | PRN

## 2020-04-12 MED ORDER — OXYTOCIN BOLUS FROM INFUSION
333.0000 mL | Freq: Once | INTRAVENOUS | Status: AC
  Administered 2020-04-12: 333 mL via INTRAVENOUS

## 2020-04-12 MED ORDER — PENICILLIN G POT IN DEXTROSE 60000 UNIT/ML IV SOLN
3.0000 10*6.[IU] | INTRAVENOUS | Status: DC
  Administered 2020-04-12: 3 10*6.[IU] via INTRAVENOUS
  Filled 2020-04-12: qty 50

## 2020-04-12 NOTE — MAU Note (Signed)
Presents with c/o "water broke" 2 hours ago, reports clear fluid.  Denies VB.  Endorses +FM.

## 2020-04-12 NOTE — Anesthesia Procedure Notes (Signed)
Epidural Patient location during procedure: OB Start time: 04/12/2020 6:48 PM End time: 04/12/2020 7:03 PM  Staffing Anesthesiologist: Barnet Glasgow, MD Performed: anesthesiologist   Preanesthetic Checklist Completed: patient identified, IV checked, site marked, risks and benefits discussed, surgical consent, monitors and equipment checked, pre-op evaluation and timeout performed  Epidural Patient position: sitting Prep: DuraPrep and site prepped and draped Patient monitoring: continuous pulse ox and blood pressure Approach: midline Location: L3-L4 Injection technique: LOR air  Needle:  Needle type: Tuohy  Needle gauge: 17 G Needle length: 9 cm and 9 Needle insertion depth: 6 cm Catheter type: closed end flexible Catheter size: 19 Gauge Catheter at skin depth: 12 cm Test dose: negative  Assessment Events: blood not aspirated, injection not painful, no injection resistance, no paresthesia and negative IV test  Additional Notes Patient identified. Risks/Benefits/Options discussed with patient including but not limited to bleeding, infection, nerve damage, paralysis, failed block, incomplete pain control, headache, blood pressure changes, nausea, vomiting, reactions to medication both or allergic, itching and postpartum back pain. Confirmed with bedside nurse the patient's most recent platelet count. Confirmed with patient that they are not currently taking any anticoagulation, have any bleeding history or any family history of bleeding disorders. Patient expressed understanding and wished to proceed. All questions were answered. Sterile technique was used throughout the entire procedure. Please see nursing notes for vital signs. Test dose was given through epidural needle and negative prior to continuing to dose epidural or start infusion. Warning signs of high block given to the patient including shortness of breath, tingling/numbness in hands, complete motor block, or any concerning  symptoms with instructions to call for help. Patient was given instructions on fall risk and not to get out of bed. All questions and concerns addressed with instructions to call with any issues. 1 Attempt (S) . Patient tolerated procedure well.

## 2020-04-12 NOTE — Progress Notes (Signed)
Patient ID: Natalie Holloway, female   DOB: [birth year], 24 y.o.   MRN: [id number] Pt complete with urge to push Plan to attempt delivery at this time Cat 1 Anticipate svd

## 2020-04-12 NOTE — Progress Notes (Signed)
Patient ID: Natalie Holloway, female   DOB: May 22, 1996, 24 y.o.   MRN: [id number] Pt is now comfortable after receiving epidural. She has no complaints VSS GEN - NAD EFM - 135, cat 1 TOCO - ctxs irreg, rare SVE - 4.5/70/-2  A/P: G2P1001 at term in latent labor now comfortable with epidural         - S/p PCN for GBS ; next due at 815pm                 - AROM performed with clear fluid noted         - Augment with pitocin         - Anticipate svd

## 2020-04-12 NOTE — Anesthesia Preprocedure Evaluation (Addendum)
Anesthesia Evaluation  Patient identified by MRN, date of birth, ID band Patient awake    Reviewed: Allergy & Precautions, NPO status , Patient's Chart, lab work & pertinent test results  Airway Mallampati: II  TM Distance: >3 FB Neck ROM: Full    Dental no notable dental hx. (+) Teeth Intact, Dental Advisory Given   Pulmonary neg pulmonary ROS,    Pulmonary exam normal breath sounds clear to auscultation       Cardiovascular hypertension, Normal cardiovascular exam Rhythm:Regular Rate:Normal     Neuro/Psych Anxiety negative neurological ROS     GI/Hepatic Neg liver ROS, Hx of Crohns dz   Endo/Other  negative endocrine ROS  Renal/GU negative Renal ROS     Musculoskeletal negative musculoskeletal ROS (+)   Abdominal   Peds  Hematology negative hematology ROS (+) Lab Results      Component                Value               Date                      WBC                      12.7 (H)            04/12/2020                HGB                      12.0                04/12/2020                HCT                      37.5                04/12/2020                MCV                      81.7                04/12/2020                PLT                      198                 04/12/2020              Anesthesia Other Findings   Reproductive/Obstetrics (+) Pregnancy                             Anesthesia Physical Anesthesia Plan  ASA: II  Anesthesia Plan: Epidural   Post-op Pain Management:    Induction:   PONV Risk Score and Plan:   Airway Management Planned:   Additional Equipment: None  Intra-op Plan:   Post-operative Plan:   Informed Consent: I have reviewed the patients History and Physical, chart, labs and discussed the procedure including the risks, benefits and alternatives for the proposed anesthesia with the patient or authorized representative who has indicated  his/her understanding and acceptance.       Plan Discussed with:  Anesthesia Plan Comments: (39.1Wk G2P1 for LEA)        Anesthesia Quick Evaluation

## 2020-04-12 NOTE — H&P (Signed)
Natalie Holloway is a 24 y.o. G71P1001 female presenting with complaint of LOF. Pt noted to be fern + on exam in MAU but no contractions noted or appreciated.. Pt is dated per 7week [place]. Her pregnancy has been complicated by anemia ( s/p fereheme), GERD, anxiety - controlled on paxil and zoloft  and crohns disease ( ondicyclomine, famotidine, pantpropazole) . She had hx of elevated LFTs in first pregnancy. She is GBS positive. NKDA . Panorama wnl OB History    Gravida  2   Para  1   Term  1   Preterm      AB      Living  1     SAB      TAB      Ectopic      Multiple  0   Live Births  1          Past Medical History:  Diagnosis Date  . Anxiety   . Crohn's disease ([place])   . Essential hypertension    resolved when discontinued OCP's  . GERD (gastroesophageal reflux disease)    Prilosec  . Ovarian cyst    no current problems as of 10/14/14  . PONV (postoperative nausea and vomiting)   . SVD (spontaneous vaginal delivery) 08/13/2018   Past Surgical History:  Procedure Laterality Date  . CHOLECYSTECTOMY N/A 10/15/2014   Procedure: LAPAROSCOPIC CHOLECYSTECTOMY;  Surgeon: Coralie Keens, MD;  Location: [facility];  Service: General;  Laterality: N/A;  . COLONOSCOPY  10/09/2017   Normal colonoscopy to terminal ileum. Incidental nodular hyperplasia of terminal ileum (normal variant)  . DRUG INDUCED ENDOSCOPY    . FRENULECTOMY, UPPER LABIAL    . SMALL BOWEL ENTEROSCOPY  07/10/2017   Small hiatal hernia. Mild gastritis. Otherwise normal enteroscopy    Family History: family history includes Healthy in her father and mother. Social History:  reports that she has never smoked. She has never used smokeless tobacco. She reports that she does not drink alcohol and does not use drugs.     Maternal Diabetes: No Genetic Screening: Normal Maternal Ultrasounds/Referrals: Normal Fetal Ultrasounds or other Referrals:  None Maternal Substance Abuse:  No Significant Maternal Medications:   Meds include: Other: see hpi Significant Maternal Lab Results:  Group B Strep positive Other Comments:  None  Review of Systems  Constitutional: Negative for activity change, diaphoresis and fatigue.  Eyes: Negative for visual disturbance.  Respiratory: Negative for chest tightness and shortness of breath.   Cardiovascular: Negative for chest pain, palpitations and leg swelling.  Gastrointestinal: Negative for nausea.  Genitourinary: Negative for pelvic pain and vaginal bleeding.  Musculoskeletal: Negative for myalgias.  Neurological: Negative for dizziness and light-headedness.  Psychiatric/Behavioral: The patient is not nervous/anxious.    Maternal Medical History:  Reason for admission: Rupture of membranes.  Nausea.  Contractions: Frequency: rare.   Perceived severity is mild.    Fetal activity: Perceived fetal activity is normal.    Prenatal complications: no prenatal complications Prenatal Complications - Diabetes: none.    Dilation: 3.5 Effacement (%): 70 Station: -2 Exam by:: F. Morris, RNC Blood pressure 115/83, pulse (!) 125, temperature 98.3 F (36.8 C), temperature source Oral, resp. rate 18, height 5' 2"  (1.575 m), weight 69.4 kg, SpO2 99 %, unknown if currently breastfeeding. Maternal Exam:  Uterine Assessment: Contraction strength is mild.  Contraction frequency is rare.   Abdomen: Patient reports generalized tenderness.  Fundal height is AGA.   Fetal presentation: vertex  Introitus: Normal vulva. Normal vagina.  Vagina is negative for condylomata.  Ferning test: positive.   Pelvis: adequate for delivery.   Cervix: Cervix evaluated by digital exam.     Fetal Exam Fetal Monitor Review: Baseline rate: 130.  Variability: moderate (6-25 bpm).   Pattern: no decelerations and accelerations present.    Fetal State Assessment: Category I - tracings are normal.     Physical Exam Vitals and nursing note reviewed. Exam conducted with a chaperone  present.  Constitutional:      Appearance: Normal appearance.  Cardiovascular:     Pulses: Normal pulses.  Abdominal:     Tenderness: There is generalized abdominal tenderness.  Genitourinary:    General: Normal vulva.  Musculoskeletal:        General: Normal range of motion.     Cervical back: Normal range of motion.  Skin:    General: Skin is warm.  Neurological:     General: No focal deficit present.     Mental Status: She is alert and oriented to person, place, and time.  Psychiatric:        Mood and Affect: Mood normal.     Prenatal labs: ABO, Rh: B/Positive/-- (12/23 0000) Antibody: Negative (12/23 0000) Rubella: Immune (12/23 0000) RPR: Nonreactive (12/23 0000)  HBsAg: Negative (12/23 0000)  HIV: Non-reactive (12/23 0000)  GBS:     Assessment/Plan: 43DT [id number] female with SROM in latent labor - Admit - Sars covid screen - GBS pos - treat with PCN - Pain control prn - Anticipate svd    Natalie Holloway 04/12/2020, 4:08 PM

## 2020-04-13 ENCOUNTER — Encounter (HOSPITAL_COMMUNITY): Payer: Self-pay | Admitting: Obstetrics and Gynecology

## 2020-04-13 ENCOUNTER — Inpatient Hospital Stay (HOSPITAL_COMMUNITY): Payer: BC Managed Care – PPO

## 2020-04-13 ENCOUNTER — Inpatient Hospital Stay (HOSPITAL_COMMUNITY)
Admission: AD | Admit: 2020-04-13 | Payer: BC Managed Care – PPO | Source: Home / Self Care | Admitting: Obstetrics and Gynecology

## 2020-04-13 LAB — CBC
HCT: 32.6 % — ABNORMAL LOW (ref 36.0–46.0)
Hemoglobin: 10.7 g/dL — ABNORMAL LOW (ref 12.0–15.0)
MCH: 26.4 pg (ref 26.0–34.0)
MCHC: 32.8 g/dL (ref 30.0–36.0)
MCV: 80.5 fL (ref 80.0–100.0)
Platelets: 179 10*3/uL (ref 150–400)
RBC: 4.05 MIL/uL (ref 3.87–5.11)
WBC: 17.8 10*3/uL — ABNORMAL HIGH (ref 4.0–10.5)
nRBC: 0 % (ref 0.0–0.2)

## 2020-04-13 LAB — RPR: RPR Ser Ql: NONREACTIVE

## 2020-04-13 MED ORDER — WITCH HAZEL-GLYCERIN EX PADS: 1.0000 "application " | MEDICATED_PAD | CUTANEOUS | Status: DC | PRN

## 2020-04-13 MED ORDER — IBUPROFEN 600 MG PO TABS
600.0000 mg | ORAL_TABLET | Freq: Four times a day (QID) | ORAL | Status: DC
  Administered 2020-04-13 – 2020-04-14 (×5): 600 mg via ORAL
  Filled 2020-04-13 (×5): qty 1

## 2020-04-13 MED ORDER — BENZOCAINE-MENTHOL 20-0.5 % EX AERO
1.0000 "application " | INHALATION_SPRAY | CUTANEOUS | Status: DC | PRN
  Administered 2020-04-13: 1 via TOPICAL
  Filled 2020-04-13: qty 56

## 2020-04-13 MED ORDER — TETANUS-DIPHTH-ACELL PERTUSSIS 5-2.5-18.5 LF-MCG/0.5 IM SUSP: 0.5000 mL | Freq: Once | INTRAMUSCULAR | Status: DC

## 2020-04-13 MED ORDER — COCONUT OIL OIL: 1.0000 "application " | TOPICAL_OIL | Status: DC | PRN

## 2020-04-13 MED ORDER — OXYCODONE HCL 5 MG PO TABS: 10.0000 mg | ORAL_TABLET | ORAL | Status: DC | PRN

## 2020-04-13 MED ORDER — ZOLPIDEM TARTRATE 5 MG PO TABS: 5.0000 mg | ORAL_TABLET | Freq: Every evening | ORAL | Status: DC | PRN

## 2020-04-13 MED ORDER — DIBUCAINE (PERIANAL) 1 % EX OINT: 1.0000 "application " | TOPICAL_OINTMENT | CUTANEOUS | Status: DC | PRN

## 2020-04-13 MED ORDER — ONDANSETRON HCL 4 MG/2ML IJ SOLN: 4.0000 mg | INTRAMUSCULAR | Status: DC | PRN

## 2020-04-13 MED ORDER — DICYCLOMINE HCL 10 MG PO CAPS
10.0000 mg | ORAL_CAPSULE | Freq: Three times a day (TID) | ORAL | Status: DC
  Filled 2020-04-13 (×2): qty 1

## 2020-04-13 MED ORDER — DIPHENHYDRAMINE HCL 25 MG PO CAPS: 25.0000 mg | ORAL_CAPSULE | Freq: Four times a day (QID) | ORAL | Status: DC | PRN

## 2020-04-13 MED ORDER — SERTRALINE HCL 100 MG PO TABS
100.0000 mg | ORAL_TABLET | Freq: Every day | ORAL | Status: DC
  Administered 2020-04-13 – 2020-04-14 (×2): 100 mg via ORAL
  Filled 2020-04-13 (×2): qty 1

## 2020-04-13 MED ORDER — ONDANSETRON HCL 4 MG PO TABS: 4.0000 mg | ORAL_TABLET | ORAL | Status: DC | PRN

## 2020-04-13 MED ORDER — PRENATAL MULTIVITAMIN CH
1.0000 | ORAL_TABLET | Freq: Every day | ORAL | Status: DC
  Administered 2020-04-13: 1 via ORAL
  Filled 2020-04-13: qty 1

## 2020-04-13 MED ORDER — ACETAMINOPHEN 325 MG PO TABS: 650.0000 mg | ORAL_TABLET | ORAL | Status: DC | PRN

## 2020-04-13 MED ORDER — SIMETHICONE 80 MG PO CHEW: 80.0000 mg | CHEWABLE_TABLET | ORAL | Status: DC | PRN

## 2020-04-13 MED ORDER — PANTOPRAZOLE SODIUM 40 MG PO TBEC
40.0000 mg | DELAYED_RELEASE_TABLET | Freq: Every day | ORAL | Status: DC
  Administered 2020-04-13 – 2020-04-14 (×2): 40 mg via ORAL
  Filled 2020-04-13 (×2): qty 1

## 2020-04-13 MED ORDER — SENNOSIDES-DOCUSATE SODIUM 8.6-50 MG PO TABS
2.0000 | ORAL_TABLET | ORAL | Status: DC
  Administered 2020-04-14: 2 via ORAL
  Filled 2020-04-13: qty 2

## 2020-04-13 MED ORDER — OXYCODONE HCL 5 MG PO TABS: 5.0000 mg | ORAL_TABLET | ORAL | Status: DC | PRN

## 2020-04-13 NOTE — Progress Notes (Addendum)
Post Partum Day 1 Subjective: no complaints, up ad lib and tolerating PO  Objective: Blood pressure 112/89, pulse 81, temperature 98 F (36.7 C), temperature source Oral, resp. rate 18, height 5' 2"  (1.575 m), weight 69.4 kg, SpO2 96 %, unknown if currently breastfeeding.  Physical Exam:  General: alert and cooperative Lochia: appropriate Uterine Fundus: firm   Recent Labs    04/12/20 1535 04/13/20 0612  HGB 12.0 10.7*  HCT 37.5 32.6*    Assessment/Plan: Plan for discharge tomorrow  Pt on bentyl for Crohn's but per literature is contraindicated for breastfeeding due to reports of infant apnea with exposure.  D/w pt and will d/c, she is going to call her GI and see if another option for preventative therapy while breastfeeding D/w pt circumcision and she would like to proceed.  Will try to do later today once peds sees baby  LOS: 1 day   Logan Bores 04/13/2020, 9:51 AM

## 2020-04-13 NOTE — Lactation Note (Signed)
This note was copied from a baby's chart. Lactation Consultation Note  Patient Name: Natalie Holloway [id number] Date: 04/13/2020 Reason for consult: Initial assessment;Term  P2 mother whose infant is now 86 hours old.  This is a term baby at 39+1 weeks.  Mother breast fed her first child (now 68 months) for one month.  Her feeding preference is breast/bottle.  Baby has had one feeding since birth.  Mother stated she has been trying to awaken him and is currently holding him STS on her chest.  He is beginning to arouse.  Offered to stay and assist with latching, however, mother politely declined feeling like she may be able to latch without any assistance.    Mother is familiar with feeding cues and hand expression and did not wish to review.  Colostrum container provided and milk storage times reviewed.  Finger feeding demonstrated.  Mother has a #20 NS at the bedside but has not yet started pumping with the DEBP.  Educated her on the benefits of pumping while using a NS but she does not wish to pump at this time.  Asked her to call her RN/LC if she decides to begin pumping and we will be happy to assist with set up.  Mother verbalized understanding.  Mother informed me that she had to use a NS with her first child also.  Acknowledged her wishes and she will call for assistance as needed.  Mom made aware of O/P services, breastfeeding support groups, community resources, and our phone # for post-discharge questions.  Mother has a DEBP for home use.  Father present.  RN updated.   Maternal Data Formula Feeding for Exclusion: Yes Reason for exclusion: Mother's choice to formula and breast feed on admission Has patient been taught Hand Expression?: Yes Does the patient have breastfeeding experience prior to this delivery?: Yes  Feeding Feeding Type:  (mother states she attempted to feed, sleepy, to call with ne)  LATCH Score                   Interventions    Lactation Tools  Discussed/Used     Consult Status Consult Status: Follow-up Date: 04/14/20 Follow-up type: In-patient    Maansi Wike R Kimanh Templeman 04/13/2020, 1:26 PM

## 2020-04-13 NOTE — Anesthesia Postprocedure Evaluation (Signed)
Anesthesia Post Note  Patient: Natalie Holloway  Procedure(s) Performed: AN AD HOC LABOR EPIDURAL     Patient location during evaluation: Mother Baby Anesthesia Type: Epidural Level of consciousness: awake and alert and oriented Pain management: satisfactory to patient Vital Signs Assessment: post-procedure vital signs reviewed and stable Respiratory status: respiratory function stable Cardiovascular status: stable Postop Assessment: no headache, no backache, epidural receding, patient able to bend at knees, no signs of nausea or vomiting, adequate PO intake and able to ambulate Anesthetic complications: no   No complications documented.  Last Vitals:  Vitals:   04/13/20 0245 04/13/20 0645  BP: 126/83 112/89  Pulse: 94 81  Resp: 18 18  Temp: 36.5 C 36.7 C  SpO2: 98% 96%    Last Pain:  Vitals:   04/13/20 0645  TempSrc: Oral  PainSc: 3    Pain Goal:                   [name]

## 2020-04-13 NOTE — Progress Notes (Signed)
MOB was referred for history of depression/anxiety. * Referral screened out by Clinical Social Worker because none of the following criteria appear to apply: ~ History of anxiety/depression during this pregnancy, or of post-partum depression following prior delivery. ~ Diagnosis of anxiety and/or depression within last 3 years. Per further chart review, MOB diagnosed with anxiety in 2017. OR * MOB's symptoms currently being treated with medication and/or therapy. Per chart review it appears that MOB is on/ has active prescription Paxil and Zoloft for anxiety.   CSW aware that MOB scored 0 on [place] with no concerns to CSW. Please reconsult if MOB wishes to speak with CSW or if new need arises.    Virgie Dad Nurah Petrides, MSW, LCSW [facility] [place] [facility] [place] [phone]

## 2020-04-14 MED ORDER — IBUPROFEN 600 MG PO TABS: 600.0000 mg | ORAL_TABLET | Freq: Four times a day (QID) | ORAL | 0 refills | Status: DC

## 2020-04-14 NOTE — Lactation Note (Addendum)
This note was copied from a baby's chart. Lactation Consultation Note  Patient Name: Natalie Holloway [id number] Date: 04/14/2020 Reason for consult: Follow-up assessment;Term;Other (Comment);Infant weight loss (6 % weight loss , post circ and sleepy)  Baby is 68 hour old ,  As LC entered the room , baby asleep lying in the middle of the bed and mom getting ready for D/C. Per mom baby seems to like the formula and does well with the Dr. Owens Shark bottle.  Mom expressed she is not sure if she is totally switching to formula or when she goes home will work on the latching. Also expressed concerns that she has a 19 month at home and it may very difficult.  LC explored 3 options - Latching , use of the Nipple Shield with appetizer and post pumping or pump and bottle feeding. If mom switches  to formula only , how to dry up her milk with tight fitting bra and cold cabbage leaves consistently around the clock until they wilt and change out.  LC also recommended when she goes home and decides she is going to breastfeed or provide EBM for her baby to consider coming back for [facility] O/P appt to for Latch assessment.  Mom denies sore nipples. Sore nipple and engorgement prevention and tx.  Per mom  has a DEBP at home.  Mom has the [facility] pamphlet with phone numbers for LC O/P and breastfeeding warm line.   Mom receptive to exploring options. [place] reassured  Her that the [facility] department is here to support her with her feeding preference what ever she decides.    Maternal Data    Feeding Feeding Type: Bottle Fed - Formula Nipple Type: Slow - flow  LATCH Score                   Interventions Interventions: Breast feeding basics reviewed  Lactation Tools Discussed/Used Tools: Other (comment) (curved tip syringe) Nipple shield size: 20;24 WIC Program: No   Consult Status Consult Status: Complete Date: 04/14/20    Myer Haff 04/14/2020, 10:58 AM

## 2020-04-14 NOTE — Progress Notes (Signed)
PPD #2 Doing well Afeb, VSS D/c home 

## 2020-04-14 NOTE — Discharge Instructions (Signed)
As per discharge pamphlet °

## 2020-04-14 NOTE — Discharge Summary (Signed)
Postpartum Discharge Summary      Patient Name: Natalie Holloway DOB: [birth year] MRN: [id number]  Date of admission: 04/12/2020 Delivery date:04/12/2020  Delivering provider: Carlynn Purl [facility]  Date of discharge: 04/14/2020  Admitting diagnosis: Normal labor [O80, Z37.9] Intrauterine pregnancy: 101w1d    Secondary diagnosis:  Active Problems:   Normal labor     [facility] With [facility]aginal Delivery      24y.o. yo [id number] 332w1das admitted in Latent Labor with SROM on 04/12/2020. Patient had an uncomplicated labor course as follows:  Membrane Rupture Time/Date: 7:48 PM ,04/12/2020   Delivery Method:Vaginal, Spontaneous  Episiotomy: None  Lacerations:  1st degree;Perineal  Patient had an uncomplicated postpartum course.  She is ambulating, tolerating a regular diet, passing flatus, and urinating well. Patient is discharged home in stable condition on 04/14/20.  Newborn Data: Birth date:04/12/2020  Birth time:11:34 PM  Gender:Female  Living status:Living  Apgars:9 ,9  Weight:3496 g    Physical exam  Vitals:   04/13/20 0645 04/13/20 1600 04/13/20 2133 04/14/20 0559  BP: 112/89 116/86 118/87 117/85  Pulse: 81 76 83 75  Resp: 18 20 16 20   Temp: 98 F (36.7 C) 98 F (36.7 C) 98.5 F (36.9 C) 98 F (36.7 C)  TempSrc: Oral Oral Oral Oral  SpO2: 96% 98% 98% 100%  Weight:      Height:       General: alert Lochia: appropriate Uterine Fundus: firm  Labs: Lab Results  Component Value Date   WBC 17.8 (H) 04/13/2020   HGB 10.7 (L) 04/13/2020   HCT 32.6 (L) 04/13/2020   MCV 80.5 04/13/2020   PLT 179 04/13/2020   CMP Latest Ref Rng & Units 04/27/2019  Glucose 70 - 99 mg/dL 100(H)  BUN 6 - 23 mg/dL 13  Creatinine 0.40 - 1.20 mg/dL 0.72  Sodium 135 - 145 mEq/L 137  Potassium 3.5 - 5.1 mEq/L 3.2(L)  Chloride 96 - 112 mEq/L 99  CO2 19 - 32 mEq/L 28  Calcium 8.4 -  10.5 mg/dL 9.9  Total Protein 6.0 - 8.3 g/dL 8.2  Total Bilirubin 0.2 - 1.2 mg/dL 0.5  Alkaline Phos 39 - 117 U/L 68  AST 0 - 37 U/L 20  ALT 0 - 35 U/L 25   Edinburgh Score: Edinburgh Postnatal Depression Scale Screening Tool 04/13/2020  I have been able to laugh and see the funny side of things. 0  I have looked forward with enjoyment to things. 0  I have blamed myself unnecessarily when things went wrong. 0  I have been anxious or worried for no good reason. 0  I have felt scared or panicky for no good reason. 0  Things have been getting on top of me. 0  I have been so unhappy that I have had difficulty sleeping. 0  I have felt sad or miserable. 0  I have been so unhappy that I have been crying. 0  The thought of harming myself has occurred to me. 0  Edinburgh Postnatal Depression Scale Total 0      After visit meds:  Allergies as of 04/14/2020   No Known Allergies  Medication List    STOP taking these medications   dicyclomine 10 MG capsule Commonly known as: BENTYL   potassium chloride SA 20 MEQ tablet Commonly known as: KLOR-CON   promethazine 25 MG suppository Commonly known as: Phenergan     TAKE these medications   acetaminophen 325 MG tablet Commonly known as: TYLENOL Take 975 mg by mouth every 6 (six) hours as needed for mild pain or headache.   AMBULATORY NON FORMULARY MEDICATION Medication Name: GI Cocktail ( equal parts of viscous lidocaine, Bentyl, Maalox) Take 10 mls four times a day as needed.   famotidine 20 MG tablet Commonly known as: Pepcid Take 1 tablet (20 mg total) by mouth at bedtime.   ibuprofen 600 MG tablet Commonly known as: ADVIL Take 1 tablet (600 mg total) by mouth every 6 (six) hours.   pantoprazole 40 MG tablet Commonly known as: Protonix Take 1 tablet (40 mg total) by mouth daily.        Discharge home in stable condition Infant Feeding: Breast Infant Disposition:home with mother Discharge instruction: per After  Visit Summary and Postpartum booklet. Activity: Advance as tolerated. Pelvic rest for 6 weeks.  Diet: routine diet  Postpartum Appointment:6 weeks Follow up Visit:  Dilworth, [place], DO. Schedule an appointment as soon as possible for a visit in 6 week(s).   Specialty: Obstetrics and Gynecology Why: postpartum Contact information: [address] [place] [address] [phone]                   04/14/2020 Clarene Duke, MD

## 2020-06-28 ENCOUNTER — Other Ambulatory Visit: Payer: Self-pay | Admitting: Gastroenterology

## 2020-09-09 NOTE — L&D Delivery Note (Addendum)
Delivery Note Natalie Holloway is a 25 y.o. G3P2002 at 75w1dadmitted for active labor.   GBS Status: negative    Labor course: Initial SVE: 6. Augmentation with: N/A. She then progressed to complete.  ROM: 0h 041mith meconium-stained fluid  Birth: At 1416 a viable female was delivered via spontaneous vaginal delivery (Presentation: cephalic; LOA). Nuchal cord present: No.  Shoulders and body delivered in usual fashion. Infant placed directly on mom's abdomen for bonding/skin-to-skin, baby dried and stimulated. Cord clamped x 2 after 1 minute and cut by FOB. Dr. BaTerri Piedran room after baby was born to deliver placenta and examine for lacerations.   Intrapartum complications:  None Anesthesia:  none Episiotomy: none Lacerations:   Suture Repair:  EBL (mL):    Infant: APGAR (1 MIN):   APGAR (5 MINS):   APGAR (10 MINS):    Infant weight: pending   Mom to postpartum.  Baby to Couplet care / Skin to Skin. Placenta to L&D      DaRenee HarderMSN, CNM 04/15/2021 2:22 PM    Addendum I arrived in room within the minute after baby precipitously delivered and took over care ( appreciate DaMaryagnes AmosNM) Baby on mothers chest. Cord clamped and cut with FOB Cord blood was obtained. Placenta delivered spontaneously; intact, schultz/ 3vc. No complications. A moderate amount of clots were evacuated. Fundus noted to be firm No lacerations noted   Mother and baby stable  Counts correct

## 2020-10-04 LAB — OB RESULTS CONSOLE RPR: RPR: NONREACTIVE

## 2020-10-04 LAB — OB RESULTS CONSOLE HIV ANTIBODY (ROUTINE TESTING): HIV: NONREACTIVE

## 2020-10-04 LAB — OB RESULTS CONSOLE HEPATITIS B SURFACE ANTIGEN: Hepatitis B Surface Ag: NEGATIVE

## 2020-10-04 LAB — OB RESULTS CONSOLE GC/CHLAMYDIA
Chlamydia: NEGATIVE
Gonorrhea: NEGATIVE

## 2020-10-04 LAB — OB RESULTS CONSOLE RUBELLA ANTIBODY, IGM: Rubella: IMMUNE

## 2020-11-29 ENCOUNTER — Ambulatory Visit: Payer: Self-pay | Admitting: Gastroenterology

## 2021-02-20 ENCOUNTER — Other Ambulatory Visit: Payer: Self-pay

## 2021-02-20 ENCOUNTER — Ambulatory Visit (HOSPITAL_COMMUNITY)
Admission: RE | Admit: 2021-02-20 | Discharge: 2021-02-20 | Disposition: A | Discharge status: Home / Self Care | Payer: BC Managed Care – PPO | Source: Ambulatory Visit | Attending: Internal Medicine | Admitting: Internal Medicine

## 2021-02-20 DIAGNOSIS — Z3A00 Weeks of gestation of pregnancy not specified: Secondary | ICD-10-CM | POA: Insufficient documentation

## 2021-02-20 DIAGNOSIS — O99019 Anemia complicating pregnancy, unspecified trimester: Secondary | ICD-10-CM | POA: Insufficient documentation

## 2021-02-20 DIAGNOSIS — D649 Anemia, unspecified: Secondary | ICD-10-CM | POA: Diagnosis not present

## 2021-02-20 MED ORDER — SODIUM CHLORIDE 0.9 % IV SOLN
INTRAVENOUS | Status: DC | PRN
  Administered 2021-02-20: 250 mL via INTRAVENOUS

## 2021-02-20 MED ORDER — SODIUM CHLORIDE 0.9 % IV SOLN
510.0000 mg | Freq: Once | INTRAVENOUS | Status: AC
  Administered 2021-02-20: 510 mg via INTRAVENOUS
  Filled 2021-02-20: qty 510

## 2021-02-20 NOTE — Progress Notes (Signed)
PATIENT CARE CENTER NOTE  Diagnosis: Anemia of Pregnancy    Provider: Paula Compton, MD   Procedure: Feraheme infusion    Note:  Patient received Feraheme infusion (1 of 2) via PIV. Tolerated well with no adverse reaction. Observed patient for 30 minutes post-infusion. Vital signs stable. Discharge instructions given. Patient to come back next week for second iron infusion. Alert, oriented and ambulatory at discharge.

## 2021-02-27 ENCOUNTER — Non-Acute Institutional Stay (HOSPITAL_COMMUNITY)
Admission: RE | Admit: 2021-02-27 | Discharge: 2021-02-27 | Disposition: A | Discharge status: Home / Self Care | Payer: BC Managed Care – PPO | Source: Ambulatory Visit | Attending: Internal Medicine | Admitting: Internal Medicine

## 2021-02-27 ENCOUNTER — Other Ambulatory Visit: Payer: Self-pay

## 2021-02-27 DIAGNOSIS — D649 Anemia, unspecified: Secondary | ICD-10-CM | POA: Insufficient documentation

## 2021-02-27 DIAGNOSIS — O99019 Anemia complicating pregnancy, unspecified trimester: Secondary | ICD-10-CM | POA: Diagnosis not present

## 2021-02-27 MED ORDER — SODIUM CHLORIDE 0.9 % IV SOLN
INTRAVENOUS | Status: DC | PRN
  Administered 2021-02-27: 250 mL via INTRAVENOUS

## 2021-02-27 MED ORDER — SODIUM CHLORIDE 0.9 % IV SOLN
510.0000 mg | Freq: Once | INTRAVENOUS | Status: AC
  Administered 2021-02-27: 510 mg via INTRAVENOUS
  Filled 2021-02-27: qty 510

## 2021-02-27 NOTE — Progress Notes (Signed)
Patient received IV Feraheme (dose 2 of 2) as ordered by Paula Compton MD. Observed for at least 30 minutes post infusion. Tolerated well, vitals stable, discharge instructions given , verbalized understanding. Patient alert, oriented, and ambulatory at the time of discharge.

## 2021-04-02 LAB — OB RESULTS CONSOLE GBS: GBS: NEGATIVE

## 2021-04-15 ENCOUNTER — Inpatient Hospital Stay (HOSPITAL_COMMUNITY)
Admission: AD | Admit: 2021-04-15 | Discharge: 2021-04-16 | DRG: 806 | Disposition: A | Discharge status: Home / Self Care | Payer: BC Managed Care – PPO | Attending: Obstetrics and Gynecology | Admitting: Obstetrics and Gynecology

## 2021-04-15 ENCOUNTER — Other Ambulatory Visit: Payer: Self-pay

## 2021-04-15 ENCOUNTER — Encounter (HOSPITAL_COMMUNITY): Payer: Self-pay | Admitting: *Deleted

## 2021-04-15 DIAGNOSIS — K5090 Crohn's disease, unspecified, without complications: Secondary | ICD-10-CM | POA: Diagnosis present

## 2021-04-15 DIAGNOSIS — Z3A38 38 weeks gestation of pregnancy: Secondary | ICD-10-CM

## 2021-04-15 DIAGNOSIS — F419 Anxiety disorder, unspecified: Secondary | ICD-10-CM | POA: Diagnosis present

## 2021-04-15 DIAGNOSIS — O26893 Other specified pregnancy related conditions, third trimester: Secondary | ICD-10-CM | POA: Diagnosis present

## 2021-04-15 DIAGNOSIS — O99344 Other mental disorders complicating childbirth: Secondary | ICD-10-CM | POA: Diagnosis present

## 2021-04-15 DIAGNOSIS — Z20822 Contact with and (suspected) exposure to covid-19: Secondary | ICD-10-CM | POA: Diagnosis present

## 2021-04-15 DIAGNOSIS — O9902 Anemia complicating childbirth: Secondary | ICD-10-CM | POA: Diagnosis present

## 2021-04-15 DIAGNOSIS — O9962 Diseases of the digestive system complicating childbirth: Secondary | ICD-10-CM | POA: Diagnosis present

## 2021-04-15 LAB — CBC
HCT: 36.9 % (ref 36.0–46.0)
Hemoglobin: 12.3 g/dL (ref 12.0–15.0)
MCH: 29.6 pg (ref 26.0–34.0)
MCHC: 33.3 g/dL (ref 30.0–36.0)
MCV: 88.9 fL (ref 80.0–100.0)
Platelets: 190 10*3/uL (ref 150–400)
RBC: 4.15 MIL/uL (ref 3.87–5.11)
RDW: 18.3 % — ABNORMAL HIGH (ref 11.5–15.5)
WBC: 18.8 10*3/uL — ABNORMAL HIGH (ref 4.0–10.5)
nRBC: 0 % (ref 0.0–0.2)

## 2021-04-15 LAB — RESP PANEL BY RT-PCR (FLU A&B, COVID) ARPGX2
Influenza A by PCR: NEGATIVE
Influenza B by PCR: NEGATIVE
SARS Coronavirus 2 by RT PCR: NEGATIVE

## 2021-04-15 MED ORDER — CALCIUM CARBONATE ANTACID 500 MG PO CHEW
1.0000 | CHEWABLE_TABLET | Freq: Three times a day (TID) | ORAL | Status: DC | PRN
  Administered 2021-04-15: 200 mg via ORAL
  Filled 2021-04-15: qty 1

## 2021-04-15 MED ORDER — BENZOCAINE-MENTHOL 20-0.5 % EX AERO
1.0000 "application " | INHALATION_SPRAY | CUTANEOUS | Status: DC | PRN
  Administered 2021-04-15: 1 via TOPICAL
  Filled 2021-04-15: qty 56

## 2021-04-15 MED ORDER — FERROUS SULFATE 325 (65 FE) MG PO TABS: 325.0000 mg | ORAL_TABLET | ORAL | Status: DC

## 2021-04-15 MED ORDER — LIDOCAINE HCL (PF) 1 % IJ SOLN: 30.0000 mL | INTRAMUSCULAR | Status: DC | PRN

## 2021-04-15 MED ORDER — ACETAMINOPHEN 325 MG PO TABS: 650.0000 mg | ORAL_TABLET | ORAL | Status: DC | PRN

## 2021-04-15 MED ORDER — PRENATAL MULTIVITAMIN CH
1.0000 | ORAL_TABLET | Freq: Every day | ORAL | Status: DC
  Administered 2021-04-16: 1 via ORAL
  Filled 2021-04-15: qty 1

## 2021-04-15 MED ORDER — SODIUM CHLORIDE 0.9 % IV SOLN
25.0000 mg | Freq: Once | INTRAVENOUS | Status: AC
  Administered 2021-04-15: 25 mg via INTRAVENOUS
  Filled 2021-04-15: qty 1

## 2021-04-15 MED ORDER — OXYTOCIN-SODIUM CHLORIDE 30-0.9 UT/500ML-% IV SOLN: 2.5000 [IU]/h | INTRAVENOUS | Status: DC | PRN

## 2021-04-15 MED ORDER — IBUPROFEN 600 MG PO TABS
600.0000 mg | ORAL_TABLET | Freq: Four times a day (QID) | ORAL | Status: DC
  Administered 2021-04-15 – 2021-04-16 (×3): 600 mg via ORAL
  Filled 2021-04-15 (×3): qty 1

## 2021-04-15 MED ORDER — BUTORPHANOL TARTRATE 1 MG/ML IJ SOLN
1.0000 mg | Freq: Once | INTRAMUSCULAR | Status: DC
  Filled 2021-04-15: qty 1

## 2021-04-15 MED ORDER — LACTATED RINGERS IV SOLN: INTRAVENOUS | Status: DC

## 2021-04-15 MED ORDER — ACETAMINOPHEN 325 MG PO TABS
650.0000 mg | ORAL_TABLET | ORAL | Status: DC | PRN
  Filled 2021-04-15: qty 2

## 2021-04-15 MED ORDER — SOD CITRATE-CITRIC ACID 500-334 MG/5ML PO SOLN: 30.0000 mL | ORAL | Status: DC | PRN

## 2021-04-15 MED ORDER — PANTOPRAZOLE SODIUM 40 MG PO TBEC
40.0000 mg | DELAYED_RELEASE_TABLET | Freq: Every day | ORAL | Status: DC
  Administered 2021-04-15: 40 mg via ORAL
  Filled 2021-04-15: qty 1

## 2021-04-15 MED ORDER — SIMETHICONE 80 MG PO CHEW: 80.0000 mg | CHEWABLE_TABLET | ORAL | Status: DC | PRN

## 2021-04-15 MED ORDER — OXYCODONE HCL 5 MG PO TABS: 10.0000 mg | ORAL_TABLET | ORAL | Status: DC | PRN

## 2021-04-15 MED ORDER — ONDANSETRON HCL 4 MG/2ML IJ SOLN
4.0000 mg | Freq: Four times a day (QID) | INTRAMUSCULAR | Status: DC | PRN
  Administered 2021-04-15: 4 mg via INTRAVENOUS
  Filled 2021-04-15: qty 2

## 2021-04-15 MED ORDER — ONDANSETRON HCL 4 MG/2ML IJ SOLN
4.0000 mg | INTRAMUSCULAR | Status: DC | PRN
  Administered 2021-04-15: 4 mg via INTRAVENOUS
  Filled 2021-04-15: qty 2

## 2021-04-15 MED ORDER — ZOLPIDEM TARTRATE 5 MG PO TABS: 5.0000 mg | ORAL_TABLET | Freq: Every evening | ORAL | Status: DC | PRN

## 2021-04-15 MED ORDER — LACTATED RINGERS IV SOLN
500.0000 mL | INTRAVENOUS | Status: DC | PRN
  Administered 2021-04-15: 1000 mL via INTRAVENOUS

## 2021-04-15 MED ORDER — OXYCODONE-ACETAMINOPHEN 5-325 MG PO TABS: 2.0000 | ORAL_TABLET | ORAL | Status: DC | PRN

## 2021-04-15 MED ORDER — WITCH HAZEL-GLYCERIN EX PADS: 1.0000 "application " | MEDICATED_PAD | CUTANEOUS | Status: DC | PRN

## 2021-04-15 MED ORDER — DIBUCAINE (PERIANAL) 1 % EX OINT: 1.0000 "application " | TOPICAL_OINTMENT | CUTANEOUS | Status: DC | PRN

## 2021-04-15 MED ORDER — SENNOSIDES-DOCUSATE SODIUM 8.6-50 MG PO TABS
2.0000 | ORAL_TABLET | Freq: Every day | ORAL | Status: DC
  Administered 2021-04-16: 2 via ORAL
  Filled 2021-04-15: qty 2

## 2021-04-15 MED ORDER — OXYTOCIN BOLUS FROM INFUSION
333.0000 mL | Freq: Once | INTRAVENOUS | Status: AC
  Administered 2021-04-15: 333 mL via INTRAVENOUS

## 2021-04-15 MED ORDER — TETANUS-DIPHTH-ACELL PERTUSSIS 5-2.5-18.5 LF-MCG/0.5 IM SUSY: 0.5000 mL | PREFILLED_SYRINGE | Freq: Once | INTRAMUSCULAR | Status: DC

## 2021-04-15 MED ORDER — OXYCODONE-ACETAMINOPHEN 5-325 MG PO TABS: 1.0000 | ORAL_TABLET | ORAL | Status: DC | PRN

## 2021-04-15 MED ORDER — OXYCODONE HCL 5 MG PO TABS: 5.0000 mg | ORAL_TABLET | ORAL | Status: DC | PRN

## 2021-04-15 MED ORDER — ONDANSETRON HCL 4 MG PO TABS: 4.0000 mg | ORAL_TABLET | ORAL | Status: DC | PRN

## 2021-04-15 MED ORDER — DIPHENHYDRAMINE HCL 25 MG PO CAPS: 25.0000 mg | ORAL_CAPSULE | Freq: Four times a day (QID) | ORAL | Status: DC | PRN

## 2021-04-15 MED ORDER — OXYTOCIN-SODIUM CHLORIDE 30-0.9 UT/500ML-% IV SOLN
2.5000 [IU]/h | INTRAVENOUS | Status: DC
  Administered 2021-04-15: 2.5 [IU]/h via INTRAVENOUS

## 2021-04-15 MED ORDER — COCONUT OIL OIL: 1.0000 "application " | TOPICAL_OIL | Status: DC | PRN

## 2021-04-15 MED ORDER — OXYTOCIN-SODIUM CHLORIDE 30-0.9 UT/500ML-% IV SOLN
INTRAVENOUS | Status: AC
  Filled 2021-04-15: qty 500

## 2021-04-15 MED ORDER — FLEET ENEMA 7-19 GM/118ML RE ENEM: 1.0000 | ENEMA | RECTAL | Status: DC | PRN

## 2021-04-15 NOTE — Progress Notes (Signed)
Pt assisted back to bed, voided while in shower

## 2021-04-15 NOTE — MAU Note (Signed)
...  Natalie Holloway is a 25 y.o. at Unknown here in MAU reporting: CTX since 0930. Patient yelling and stating she was going to vomit. Len Blalock, CNM, at bedside. SVE patient found to be 6 cm with a bulging bag. Dr. Terri Piedra, DO, notified. L&D Charge Nurse notified and report given by Fredda Hammed, RN.   Room 216 given.

## 2021-04-15 NOTE — H&P (Signed)
Natalie Holloway is a 25 y.L.[id number] female presenting in active labor at 13 1/7wks. She is dated per LMP which was confirmed with an 11week [place].  Pt begun having contractions in last 12hrs but got more persistent just prior to arrival to hospital. She was 6cm on arrival and 9cm with a bulging bag on arrival on the floor with uncontrollable urge to push. Her pregnancy has been complicated by anxiety - controlled on escitalopram; chrons disease - controlled on dicyclomine, famotidine and pantoprazole; anemia of pregnancy - s/p IV iron infusion. She has a history of HTN in her teens - no meds and normal BP in pregnancy.  She is GBS negative. Declined genetic and carrier screening.  OB History     Gravida  3   Para  2   Term  2   Preterm      AB      Living  2      SAB      IAB      Ectopic      Multiple  0   Live Births  2          Past Medical History:  Diagnosis Date   Anxiety    Crohn's disease ([place])    Essential hypertension    resolved when discontinued OCP's   GERD (gastroesophageal reflux disease)    Prilosec   Ovarian cyst    no current problems as of 10/14/14   PONV (postoperative nausea and vomiting)    SVD (spontaneous vaginal delivery) 08/13/2018   Past Surgical History:  Procedure Laterality Date   CHOLECYSTECTOMY N/A 10/15/2014   Procedure: LAPAROSCOPIC CHOLECYSTECTOMY;  Surgeon: Coralie Keens, MD;  Location: [facility];  Service: General;  Laterality: N/A;   COLONOSCOPY  10/09/2017   Normal colonoscopy to terminal ileum. Incidental nodular hyperplasia of terminal ileum (normal variant)   DRUG INDUCED ENDOSCOPY     FRENULECTOMY, UPPER LABIAL     SMALL BOWEL ENTEROSCOPY  07/10/2017   Small hiatal hernia. Mild gastritis. Otherwise normal enteroscopy    Family History: family history includes Healthy in her father and mother. Social History:  reports that she has never smoked. She has never used smokeless tobacco. She reports that she does not drink alcohol  and does not use drugs.     Maternal Diabetes: No Genetic Screening: Declined Maternal Ultrasounds/Referrals: Normal Fetal Ultrasounds or other Referrals:  None Maternal Substance Abuse:  No Significant Maternal Medications:  Meds include: Other: see hpi Significant Maternal Lab Results:  Group B Strep negative Other Comments:  None  Review of Systems  Constitutional:  Positive for activity change and fatigue.  Eyes:  Negative for photophobia and visual disturbance.  Respiratory:  Positive for shortness of breath. Negative for chest tightness.   Cardiovascular:  Negative for chest pain, palpitations and leg swelling.  Gastrointestinal:  Positive for abdominal pain.  Genitourinary:  Positive for pelvic pain.  Neurological:  Negative for headaches.  Psychiatric/Behavioral:  The patient is nervous/anxious.   Maternal Medical History:  Reason for admission: Contractions.   Contractions: Onset was 6-12 hours ago.   Frequency: regular.   Perceived severity is strong.   Fetal activity: Perceived fetal activity is normal.   Prenatal complications: no prenatal complications Prenatal Complications - Diabetes: none.  Dilation: 6 Exam by:: Sharolyn Douglas, CNM Blood pressure 127/84, pulse 95, temperature 97.7 F (36.5 C), temperature source Oral, resp. rate 19, unknown if currently breastfeeding. Maternal Exam:  Uterine Assessment: Contraction strength is firm.  Contraction  frequency is regular.  Abdomen: Patient reports generalized tenderness.  Estimated fetal weight is AGA.   Introitus: Normal vulva. Normal vagina.  Amniotic fluid character: meconium stained. Pelvis: adequate for delivery.   Cervix: Cervix evaluated by digital exam.     Fetal Exam Fetal Monitor Review: Variability: moderate (6-25 bpm).   Pattern: accelerations present.    Physical Exam Vitals and nursing note reviewed. Exam conducted with a chaperone present.  Constitutional:      Appearance: Normal  appearance. She is normal weight.  Abdominal:     Tenderness: There is generalized abdominal tenderness.  Genitourinary:    General: Normal vulva.  Musculoskeletal:        General: Normal range of motion.  Skin:    General: Skin is warm.     Capillary Refill: Capillary refill takes 2 to 3 seconds.  Neurological:     General: No focal deficit present.     Mental Status: She is alert and oriented to person, place, and time. Mental status is at baseline.  Psychiatric:        Mood and Affect: Mood normal.        Behavior: Behavior normal.        Thought Content: Thought content normal.        Judgment: Judgment normal.    Prenatal labs: ABO, Rh: --/--/PENDING (08/07 1350) Antibody: PENDING (08/07 1350) Rubella:   RPR:    HBsAg:    HIV:    GBS:     Assessment/Plan: 25yo [id number] female at 27 1/7wks in active labor - Admit - Imminent delivery - Nitrous for pain control - Covid screen   Natalie Holloway 04/15/2021, 2:42 PM

## 2021-04-15 NOTE — Progress Notes (Signed)
Pt is tearful/not wanting to see or touch baby/just feels bad/nausea  no vomiting noted/refuses to eat/helped into shower to help her "nerves" and she swatted the whole time/assisted back to bed

## 2021-04-15 NOTE — Plan of Care (Signed)
Pt demonstrated understanding

## 2021-04-15 NOTE — Progress Notes (Signed)
IV bolus started

## 2021-04-15 NOTE — Progress Notes (Signed)
Pt assisted up to stand in shower

## 2021-04-15 NOTE — Lactation Note (Signed)
This note was copied from a baby's chart. Lactation Consultation Note  Patient Name: Natalie Holloway [id number] Date: 04/15/2021 Age:27 hours  LC in to room for initial visit. Mother declines visit at this time because she is feeling sick and nauseous.  [place] sill attempt visit at a later time.   Natalie Holloway A Higuera Ancidey 04/15/2021, 8:23 PM

## 2021-04-16 LAB — TYPE AND SCREEN
ABO/RH(D): B POS
Antibody Screen: NEGATIVE

## 2021-04-16 LAB — CBC
HCT: 30.9 % — ABNORMAL LOW (ref 36.0–46.0)
Hemoglobin: 10.9 g/dL — ABNORMAL LOW (ref 12.0–15.0)
MCH: 30.8 pg (ref 26.0–34.0)
MCHC: 35.3 g/dL (ref 30.0–36.0)
MCV: 87.3 fL (ref 80.0–100.0)
Platelets: 195 10*3/uL (ref 150–400)
RBC: 3.54 MIL/uL — ABNORMAL LOW (ref 3.87–5.11)
RDW: 18.1 % — ABNORMAL HIGH (ref 11.5–15.5)
WBC: 22.5 10*3/uL — ABNORMAL HIGH (ref 4.0–10.5)
nRBC: 0 % (ref 0.0–0.2)

## 2021-04-16 LAB — RPR: RPR Ser Ql: NONREACTIVE

## 2021-04-16 MED ORDER — IBUPROFEN 600 MG PO TABS: 600.0000 mg | ORAL_TABLET | Freq: Four times a day (QID) | ORAL | 0 refills | Status: AC

## 2021-04-16 NOTE — Lactation Note (Signed)
This note was copied from a baby's chart. Lactation Consultation Note  Patient Name: Natalie Holloway [id number] Date: 04/16/2021 Reason for consult: Initial assessment;Term Age:27 hours  Mom is a P3 who nursed her 1st child for 1 month, but had mastitis twice during that time period.  Infant has received formula thus far, but Mom is interested in seeing if infant will latch. Mom shared that she had to use a nipple shield with her 1st baby b/c of flat nipples. At this time, Mom's nipples are erect and I don't anticipate that she will need a nipple shield.   Mom will call for me when she is ready for me to return. Mom has an Elvie pump for home use.   Mom was made aware of O/P services, breastfeeding support groups, and our phone # for post-discharge questions.    Matthias Hughs [facility] 04/16/2021, 10:15 AM

## 2021-04-16 NOTE — Social Work (Signed)
CSW consulted due to "pt very apathetic after precip delivery.  Only held infant a few minutes skin to skin and did not want to breastfeed infant."  CSW met with MOB to assess and offer support. CSW introduced self and role. CSW observed infant "Alinda Sierras' in bassinet and FOB [place] bedside. MOB declined to have FOB leave the room for assessment. CSW informed MOB of the reason for consult and assessed current feelings. MOB reported she is doing "pretty good, better than yesterday." MOB expressed she had a traumatic labor experience due to living 40 min away and infant being birthed as soon as they made it to the hospital. MOB expressed understanding and validated MOB's feelings. MOB shared the pregnancy went well and she is currently feeling bonded with infant.  MOB disclosed she has a history of anxiety, which she was diagnosed with in 2019. MOB stated she is currently on Lexapro 77m, which helps manage symptoms. MOB has attended therapy in the past and reported she still has access if needs arise. MOB identified FOB and both families as supports. MOB denies any current SI or HI. MOB was not observed displaying any acute mental health symptoms.  CSW provided education regarding the baby blues period versus PPD and provided additonal resources. CSW provided the New Mom Checklist and encouraged MOB to self evaluate and contact a medical professional if symptoms are noted at any time.   CSW provided review of Sudden Infant Death Syndrome (SIDS) precautions. Parents have all infant essentials, including a bassinet and car seat. MOB identified [facility] for follow-up care and denies any barriers to care. MOB denies having any additional needs at this time.  CSW identifies no further need for intervention and no barriers to discharge at this time.  CDarra Lis LKurtistownWork WEnterprise Productsand CMolson [phone]

## 2021-04-16 NOTE — Discharge Instructions (Signed)
As per discharge pamphlet °

## 2021-04-16 NOTE — Lactation Note (Signed)
This note was copied from a baby's chart. Lactation Consultation Note Mom in [place]. Has tried a couple of times to see mom and has been in BR. Mom is giving formula at this time. Didn't want to see Lactation earlier d/t not feeling well. LC was seeing if mom was feeling better.  Patient Name: Natalie Holloway [id number] Date: 04/16/2021   Age:27 hours  Maternal Data    Feeding    LATCH Score                    Lactation Tools Discussed/Used    Interventions    Discharge    Consult Status      Theodoro Kalata 04/16/2021, 4:24 AM

## 2021-04-16 NOTE — Progress Notes (Signed)
PPD #1 No problems, wants to go home today Afeb, VSS Fundus firm, NT at U-1 Continue routine postpartum care, d/c home this pm

## 2021-04-16 NOTE — Discharge Summary (Signed)
Postpartum Discharge Summary      Patient Name: Natalie Holloway DOB: 1996-03-06 MRN: [id number]  Date of admission: 04/15/2021 Delivery date:04/15/2021  Delivering provider: Carlynn Purl [facility]  Date of discharge: 04/16/2021  Admitting diagnosis: Normal labor [O80, Z37.9] Intrauterine pregnancy: Unknown     Secondary diagnosis:  Active Problems:   Vaginal delivery  Additional problems: Crohns disease    Discharge diagnosis: Term Pregnancy [place] [facility] course: Onset of Labor With Vaginal Delivery      25 y.o. yo G3P2003 at Unknown was admitted in Active Labor on 04/15/2021. Patient had an uncomplicated labor course as follows:  Membrane Rupture Time/Date: 2:14 PM ,04/15/2021   Delivery Method:Vaginal, Spontaneous  Episiotomy: None  Lacerations:  None  Patient had an uncomplicated postpartum course.  She is ambulating, tolerating a regular diet, passing flatus, and urinating well. Patient is discharged home in stable condition on 04/16/21.  Newborn Data: Birth date:04/15/2021  Birth time:2:16 PM  Gender:Female  Living status:Living  Apgars:8 ,7  Weight:2870 g    Physical exam  Vitals:   04/15/21 1732 04/15/21 2100 04/16/21 0055 04/16/21 0500  BP: (!) 125/92 (!) 133/98 105/70 113/75  Pulse: 79 76 94 80  Resp: 18 16 17 16   Temp: 97.9 F (36.6 C) 97.8 F (36.6 C) 99 F (37.2 C) 98.6 F (37 C)  TempSrc:  Oral Oral Oral  SpO2:  99% 98% 99%   General: alert Lochia: appropriate Uterine Fundus: firm  Labs: Lab Results  Component Value Date   WBC 22.5 (H) 04/16/2021   HGB 10.9 (L) 04/16/2021   HCT 30.9 (L) 04/16/2021   MCV 87.3 04/16/2021   PLT 195 04/16/2021   CMP Latest Ref Rng & Units 04/27/2019  Glucose 70 - 99 mg/dL 100(H)  BUN 6 - 23 mg/dL 13  Creatinine 0.40 - 1.20 mg/dL 0.72  Sodium 135 - 145 mEq/L 137  Potassium 3.5 - 5.1 mEq/L 3.2(L)  Chloride 96 - 112 mEq/L 99  CO2 19 - 32 mEq/L 28  Calcium 8.4 -  10.5 mg/dL 9.9  Total Protein 6.0 - 8.3 g/dL 8.2  Total Bilirubin 0.2 - 1.2 mg/dL 0.5  Alkaline Phos 39 - 117 U/L 68  AST 0 - 37 U/L 20  ALT 0 - 35 U/L 25   Edinburgh Score: Edinburgh Postnatal Depression Scale Screening Tool 04/13/2020  I have been able to laugh and see the funny side of things. 0  I have looked forward with enjoyment to things. 0  I have blamed myself unnecessarily when things went wrong. 0  I have been anxious or worried for no good reason. 0  I have felt scared or panicky for no good reason. 0  Things have been getting on top of me. 0  I have been so unhappy that I have had difficulty sleeping. 0  I have felt sad or miserable. 0  I have been so unhappy that I have been crying. 0  The thought of harming myself has occurred to me. 0  Edinburgh Postnatal Depression Scale Total 0      After visit meds:  Allergies as of 04/16/2021   No Known Allergies      Medication List     TAKE  these medications    acetaminophen 325 MG tablet Commonly known as: TYLENOL Take 975 mg by mouth every 6 (six) hours as needed for mild pain or headache.   AMBULATORY NON FORMULARY MEDICATION Medication Name: GI Cocktail ( equal parts of viscous lidocaine, Bentyl, Maalox) Take 10 mls four times a day as needed.   famotidine 20 MG tablet Commonly known as: Pepcid Take 1 tablet (20 mg total) by mouth at bedtime.   ibuprofen 600 MG tablet Commonly known as: ADVIL Take 1 tablet (600 mg total) by mouth every 6 (six) hours.   pantoprazole 40 MG tablet Commonly known as: Protonix Take 1 tablet (40 mg total) by mouth daily.         Discharge home in stable condition Infant Feeding: Breast Infant Disposition:home with mother Discharge instruction: per After Visit Summary and Postpartum booklet. Activity: Advance as tolerated. Pelvic rest for 6 weeks.  Diet: routine diet Postpartum Appointment:6 weeks Follow up Visit:  Dale, [place],  DO. Schedule an appointment as soon as possible for a visit in 6 week(s).   Specialty: Obstetrics and Gynecology Contact information: [address] [place] [address] [phone]                     04/16/2021 Clarene Duke, MD

## 2021-04-21 ENCOUNTER — Inpatient Hospital Stay (HOSPITAL_COMMUNITY)
Admission: AD | Admit: 2021-04-21 | Payer: BC Managed Care – PPO | Source: Home / Self Care | Admitting: Obstetrics and Gynecology

## 2021-04-21 ENCOUNTER — Inpatient Hospital Stay (HOSPITAL_COMMUNITY): Payer: BC Managed Care – PPO

## 2021-04-28 ENCOUNTER — Telehealth (HOSPITAL_COMMUNITY): Payer: Self-pay | Admitting: *Deleted

## 2021-04-28 NOTE — Telephone Encounter (Signed)
Attempted Hospital Discharge Follow-Up Call.  No answer.  Unable to leave message.

## 2021-12-06 ENCOUNTER — Emergency Department (HOSPITAL_COMMUNITY): Payer: BC Managed Care – PPO

## 2021-12-06 ENCOUNTER — Encounter (HOSPITAL_COMMUNITY): Payer: Self-pay

## 2021-12-06 ENCOUNTER — Other Ambulatory Visit: Payer: Self-pay

## 2021-12-06 ENCOUNTER — Emergency Department (HOSPITAL_COMMUNITY)
Admission: EM | Admit: 2021-12-06 | Discharge: 2021-12-08 | Disposition: A | Discharge status: Home / Self Care | Payer: BC Managed Care – PPO | Attending: Emergency Medicine | Admitting: Emergency Medicine

## 2021-12-06 DIAGNOSIS — R112 Nausea with vomiting, unspecified: Secondary | ICD-10-CM | POA: Insufficient documentation

## 2021-12-06 DIAGNOSIS — F333 Major depressive disorder, recurrent, severe with psychotic symptoms: Secondary | ICD-10-CM | POA: Diagnosis not present

## 2021-12-06 DIAGNOSIS — E876 Hypokalemia: Secondary | ICD-10-CM | POA: Diagnosis not present

## 2021-12-06 DIAGNOSIS — R000 Tachycardia, unspecified: Secondary | ICD-10-CM | POA: Diagnosis not present

## 2021-12-06 DIAGNOSIS — F411 Generalized anxiety disorder: Secondary | ICD-10-CM | POA: Diagnosis not present

## 2021-12-06 DIAGNOSIS — Z79899 Other long term (current) drug therapy: Secondary | ICD-10-CM | POA: Insufficient documentation

## 2021-12-06 DIAGNOSIS — Z20822 Contact with and (suspected) exposure to covid-19: Secondary | ICD-10-CM | POA: Insufficient documentation

## 2021-12-06 DIAGNOSIS — Z046 Encounter for general psychiatric examination, requested by authority: Secondary | ICD-10-CM | POA: Diagnosis present

## 2021-12-06 DIAGNOSIS — F419 Anxiety disorder, unspecified: Secondary | ICD-10-CM | POA: Diagnosis present

## 2021-12-06 DIAGNOSIS — R440 Auditory hallucinations: Secondary | ICD-10-CM

## 2021-12-06 DIAGNOSIS — N9489 Other specified conditions associated with female genital organs and menstrual cycle: Secondary | ICD-10-CM | POA: Insufficient documentation

## 2021-12-06 DIAGNOSIS — F430 Acute stress reaction: Secondary | ICD-10-CM

## 2021-12-06 LAB — CBC WITH DIFFERENTIAL/PLATELET
Abs Immature Granulocytes: 0.04 10*3/uL (ref 0.00–0.07)
Basophils Absolute: 0.1 10*3/uL (ref 0.0–0.1)
Basophils Relative: 0 %
Eosinophils Absolute: 0 10*3/uL (ref 0.0–0.5)
Eosinophils Relative: 0 %
HCT: 43.9 % (ref 36.0–46.0)
Hemoglobin: 15.4 g/dL — ABNORMAL HIGH (ref 12.0–15.0)
Immature Granulocytes: 0 %
Lymphocytes Relative: 12 %
Lymphs Abs: 1.6 10*3/uL (ref 0.7–4.0)
MCH: 29.5 pg (ref 26.0–34.0)
MCHC: 35.1 g/dL (ref 30.0–36.0)
MCV: 84.1 fL (ref 80.0–100.0)
Monocytes Absolute: 1.3 10*3/uL — ABNORMAL HIGH (ref 0.1–1.0)
Monocytes Relative: 10 %
Neutro Abs: 10.9 10*3/uL — ABNORMAL HIGH (ref 1.7–7.7)
Neutrophils Relative %: 78 %
Platelets: 457 10*3/uL — ABNORMAL HIGH (ref 150–400)
RBC: 5.22 MIL/uL — ABNORMAL HIGH (ref 3.87–5.11)
RDW: 12.4 % (ref 11.5–15.5)
WBC: 13.9 10*3/uL — ABNORMAL HIGH (ref 4.0–10.5)
nRBC: 0 % (ref 0.0–0.2)

## 2021-12-06 LAB — RESP PANEL BY RT-PCR (FLU A&B, COVID) ARPGX2
Influenza A by PCR: NEGATIVE
Influenza B by PCR: NEGATIVE
SARS Coronavirus 2 by RT PCR: NEGATIVE

## 2021-12-06 LAB — COMPREHENSIVE METABOLIC PANEL
ALT: 17 U/L (ref 0–44)
AST: 16 U/L (ref 15–41)
Albumin: 4.5 g/dL (ref 3.5–5.0)
Alkaline Phosphatase: 65 U/L (ref 38–126)
Anion gap: 10 (ref 5–15)
BUN: 7 mg/dL (ref 6–20)
CO2: 27 mmol/L (ref 22–32)
Calcium: 9.5 mg/dL (ref 8.9–10.3)
Chloride: 98 mmol/L (ref 98–111)
Creatinine, Ser: 0.58 mg/dL (ref 0.44–1.00)
GFR, Estimated: >60 mL/min (ref >60)
Glucose, Bld: 124 mg/dL — ABNORMAL HIGH (ref 70–99)
Potassium: 2.6 mmol/L — CL (ref 3.5–5.1)
Sodium: 135 mmol/L (ref 135–145)
Total Bilirubin: 0.9 mg/dL (ref 0.3–1.2)
Total Protein: 8.9 g/dL — ABNORMAL HIGH (ref 6.5–8.1)

## 2021-12-06 LAB — SALICYLATE LEVEL: Salicylate Lvl: <7 mg/dL — ABNORMAL LOW (ref 7.0–30.0)

## 2021-12-06 LAB — BASIC METABOLIC PANEL
Anion gap: 4 — ABNORMAL LOW (ref 5–15)
BUN: 6 mg/dL (ref 6–20)
CO2: 28 mmol/L (ref 22–32)
Calcium: 8.6 mg/dL — ABNORMAL LOW (ref 8.9–10.3)
Chloride: 105 mmol/L (ref 98–111)
Creatinine, Ser: 0.53 mg/dL (ref 0.44–1.00)
GFR, Estimated: >60 mL/min (ref >60)
Glucose, Bld: 103 mg/dL — ABNORMAL HIGH (ref 70–99)
Potassium: 3.3 mmol/L — ABNORMAL LOW (ref 3.5–5.1)
Sodium: 137 mmol/L (ref 135–145)

## 2021-12-06 LAB — RAPID URINE DRUG SCREEN, HOSP PERFORMED
Amphetamines: NOT DETECTED
Barbiturates: NOT DETECTED
Benzodiazepines: NOT DETECTED
Cocaine: NOT DETECTED
Opiates: NOT DETECTED
Tetrahydrocannabinol: POSITIVE — AB

## 2021-12-06 LAB — I-STAT BETA HCG BLOOD, ED (MC, WL, AP ONLY): I-stat hCG, quantitative: <5 m[IU]/mL (ref <5)

## 2021-12-06 LAB — ETHANOL: Alcohol, Ethyl (B): <10 mg/dL (ref <10)

## 2021-12-06 LAB — LIPASE, BLOOD: Lipase: 76 U/L — ABNORMAL HIGH (ref 11–51)

## 2021-12-06 LAB — ACETAMINOPHEN LEVEL: Acetaminophen (Tylenol), Serum: <10 ug/mL — ABNORMAL LOW (ref 10–30)

## 2021-12-06 LAB — MAGNESIUM: Magnesium: 1.9 mg/dL (ref 1.7–2.4)

## 2021-12-06 MED ORDER — FAMOTIDINE 20 MG PO TABS
20.0000 mg | ORAL_TABLET | Freq: Every day | ORAL | Status: DC
  Administered 2021-12-06 – 2021-12-07 (×2): 20 mg via ORAL
  Filled 2021-12-06 (×2): qty 1

## 2021-12-06 MED ORDER — SODIUM CHLORIDE 0.9 % IV BOLUS
1000.0000 mL | Freq: Once | INTRAVENOUS | Status: AC
  Administered 2021-12-06: 1000 mL via INTRAVENOUS

## 2021-12-06 MED ORDER — LORAZEPAM 1 MG PO TABS
2.0000 mg | ORAL_TABLET | Freq: Once | ORAL | Status: DC
  Filled 2021-12-06: qty 2

## 2021-12-06 MED ORDER — IOHEXOL 300 MG/ML  SOLN
100.0000 mL | Freq: Once | INTRAMUSCULAR | Status: AC | PRN
  Administered 2021-12-06: 100 mL via INTRAVENOUS

## 2021-12-06 MED ORDER — PANTOPRAZOLE SODIUM 40 MG PO TBEC
40.0000 mg | DELAYED_RELEASE_TABLET | Freq: Every day | ORAL | Status: DC
  Administered 2021-12-08: 40 mg via ORAL
  Filled 2021-12-06: qty 1

## 2021-12-06 MED ORDER — ACETAMINOPHEN 325 MG PO TABS: 650.0000 mg | ORAL_TABLET | ORAL | Status: DC | PRN

## 2021-12-06 MED ORDER — POTASSIUM CHLORIDE CRYS ER 20 MEQ PO TBCR
40.0000 meq | EXTENDED_RELEASE_TABLET | Freq: Once | ORAL | Status: AC
  Administered 2021-12-06: 40 meq via ORAL
  Filled 2021-12-06: qty 2

## 2021-12-06 MED ORDER — LORAZEPAM 2 MG/ML IJ SOLN
0.5000 mg | Freq: Once | INTRAMUSCULAR | Status: AC
  Administered 2021-12-06: 0.5 mg via INTRAVENOUS
  Filled 2021-12-06: qty 1

## 2021-12-06 MED ORDER — POTASSIUM CHLORIDE 10 MEQ/100ML IV SOLN
10.0000 meq | INTRAVENOUS | Status: AC
  Administered 2021-12-06 (×3): 10 meq via INTRAVENOUS
  Filled 2021-12-06 (×3): qty 100

## 2021-12-06 MED ORDER — LORAZEPAM 2 MG/ML IJ SOLN
0.5000 mg | Freq: Once | INTRAMUSCULAR | Status: AC
  Administered 2021-12-07: 0.5 mg via INTRAVENOUS
  Filled 2021-12-06: qty 1

## 2021-12-06 MED ORDER — LORAZEPAM 2 MG/ML IJ SOLN
1.0000 mg | Freq: Once | INTRAMUSCULAR | Status: AC
Start: 2021-12-06 — End: 2021-12-06
  Administered 2021-12-06: 1 mg via INTRAVENOUS
  Filled 2021-12-06: qty 1

## 2021-12-06 MED ORDER — HYDROXYZINE HCL 25 MG PO TABS
50.0000 mg | ORAL_TABLET | Freq: Once | ORAL | Status: AC
  Administered 2021-12-06: 50 mg via ORAL
  Filled 2021-12-06: qty 2

## 2021-12-06 MED ORDER — PAROXETINE HCL 20 MG PO TABS
30.0000 mg | ORAL_TABLET | Freq: Every day | ORAL | Status: DC
  Filled 2021-12-06 (×2): qty 1

## 2021-12-06 MED ORDER — POTASSIUM CHLORIDE 10 MEQ/100ML IV SOLN
10.0000 meq | INTRAVENOUS | Status: DC
  Administered 2021-12-06 (×2): 10 meq via INTRAVENOUS
  Filled 2021-12-06 (×2): qty 100

## 2021-12-06 MED ORDER — CLONAZEPAM 0.125 MG PO TBDP
0.2500 mg | ORAL_TABLET | Freq: Four times a day (QID) | ORAL | Status: DC | PRN
  Administered 2021-12-07 – 2021-12-08 (×2): 0.5 mg via ORAL
  Filled 2021-12-06 (×3): qty 4

## 2021-12-06 MED ORDER — ONDANSETRON HCL 4 MG PO TABS: 4.0000 mg | ORAL_TABLET | Freq: Three times a day (TID) | ORAL | Status: DC | PRN

## 2021-12-06 NOTE — ED Notes (Signed)
Pt states she has been having panic attack for 3 days, been taking her paxil with no relief. States she feels like she can't sit still. ?

## 2021-12-06 NOTE — ED Triage Notes (Addendum)
Pt BIB EMS from [facility]. Per EMS pt states she has been having anxiety/panic attacks x1 week. Pt states she was recently in ED for same.  ?HR 120 ?160/100 ?CBG 129 ?EMS states pt was asking for fluids ?20 G L FA- 4 mg zofran given by EMS ?12 Lead  ?Provider from [facility] stated to EMS that pt needs to be admitted to be taken off Paxil.  ?

## 2021-12-06 NOTE — Assessment & Plan Note (Signed)
History of chronic episodic nausea and vomiting.  Evaluated by GI in the past, felt to have functional component with anxiety/panic attack induced GI symptoms.  Also could be exacerbated by marijuana use. ?-Lipase only mildly elevated, doubt acute pancreatitis at this time ?-Continue supportive care with IV fluids and antiemetics as needed ?-Consider CT abdomen/pelvis to assess for other medical etiology of symptoms; if negative would recommend follow-up with behavioral health ?

## 2021-12-06 NOTE — Assessment & Plan Note (Signed)
Potassium 2.6 on arrival with magnesium 1.9.  Likely related to GI losses from emesis and diarrhea. ?-Continue IV repletion ?-Repeat potassium improved to 3.3, no medical indication to admit for further management at this time ?

## 2021-12-06 NOTE — ED Provider Notes (Addendum)
?[facility] DEPT ?Provider Note ? ? ?CSN: [id number] ?Arrival date & time: 12/06/21  1502 ? ?  ? ?History ? ?Chief Complaint  ?Patient presents with  ? Anxiety  ? ? ?Natalie Holloway is a 26 y.o. female with past medical history of generalized anxiety disorder, PMDD.  Presents to the emergency department for complaint of anxiety and auditory hallucinations.  Patient states that she has been having increased panic attacks over the last 4 to 5 days.  States that she does not have a specific trigger for these panic attacks.  Patient has been taking her Paxil medication as prescribed.  Patient took Klonopin last night with minimal improvement in her symptoms.  Patient states that he is currently on her menstrual period is unsure if her increased anxiety is due to her history of PMDD.  Patient states that she has had difficulty sleeping over the last 4 days.  Patient endorses withdrawal hallucinations.  Patient states she is hearing "I am a failure, on a bad mom, on a bad life."  Patient denies any suicidal ideations however states "I just do not want to be here anymore because I am miserable."  Patient denies any homicidal ideations or visual hallucinations. ? ?Patient does endorse nausea.  Patient states that she passed out earlier today after episode of hyperventilating.  Patient reports that she was sent here by her outpatient psychiatrist "to be admitted for a few days." ? ?Patient denies any illicit drug use or alcohol use. ? ? ?Anxiety ?Pertinent negatives include no chest pain, no abdominal pain, no headaches and no shortness of breath.  ? ?  ? ?Home Medications ?Prior to Admission medications   ?Medication Sig Start Date End Date Taking? Authorizing Provider  ?acetaminophen (TYLENOL) 325 MG tablet Take 975 mg by mouth every 6 (six) hours as needed for mild pain or headache.    [provider]  ?AMBULATORY NON FORMULARY MEDICATION Medication Name: GI Cocktail ( equal parts of  viscous lidocaine, Bentyl, Maalox) ?Take 10 mls four times a day as needed. 04/27/19   Jackquline Denmark, MD  ?famotidine (PEPCID) 20 MG tablet Take 1 tablet (20 mg total) by mouth at bedtime. 12/15/18   Jackquline Denmark, MD  ?ibuprofen (ADVIL) 600 MG tablet Take 1 tablet (600 mg total) by mouth every 6 (six) hours. 04/16/21   Meisinger, Sherren Mocha, MD  ?pantoprazole (PROTONIX) 40 MG tablet Take 1 tablet (40 mg total) by mouth daily. 04/27/19   Jackquline Denmark, MD  ?   ? ?Allergies    ?Patient has no known allergies.   ? ?Review of Systems   ?Review of Systems  ?Constitutional:  Negative for chills and fever.  ?Eyes:  Negative for visual disturbance.  ?Respiratory:  Negative for shortness of breath.   ?Cardiovascular:  Negative for chest pain.  ?Gastrointestinal:  Positive for nausea. Negative for abdominal pain and vomiting.  ?Genitourinary:  Positive for vaginal bleeding. Negative for difficulty urinating, dysuria and vaginal discharge.  ?Musculoskeletal:  Negative for back pain and neck pain.  ?Skin:  Negative for color change and rash.  ?Neurological:  Positive for syncope. Negative for dizziness, light-headedness and headaches.  ?Psychiatric/Behavioral:  Positive for hallucinations and sleep disturbance. Negative for confusion, self-injury and suicidal ideas. The patient is nervous/anxious.   ? ?Physical Exam ?Updated Vital Signs ?BP (!) 176/114   Pulse (!) 112   Temp 97.9 ?F (36.6 ?C) (Oral)   Resp (!) 23   SpO2 98%  ?Physical Exam ?Vitals and nursing  note reviewed.  ?Constitutional:   ?   General: She is not in acute distress. ?   Appearance: She is not ill-appearing, toxic-appearing or diaphoretic.  ?HENT:  ?   Head: Normocephalic.  ?Eyes:  ?   General: No scleral icterus.    ?   Right eye: No discharge.     ?   Left eye: No discharge.  ?Cardiovascular:  ?   Rate and Rhythm: Tachycardia present.  ?   Heart sounds: Normal heart sounds, S1 normal and S2 normal. No murmur heard. ?Pulmonary:  ?   Effort: Pulmonary effort is  normal. No tachypnea, bradypnea or respiratory distress.  ?   Breath sounds: Normal breath sounds. No stridor.  ?Abdominal:  ?   General: Abdomen is flat. There is no distension. There are no signs of injury.  ?   Palpations: Abdomen is soft. There is no mass or pulsatile mass.  ?   Tenderness: There is no abdominal tenderness. There is no guarding or rebound.  ?Skin: ?   General: Skin is warm and dry.  ?Neurological:  ?   General: No focal deficit present.  ?   Mental Status: She is alert.  ?Psychiatric:     ?   Attention and Perception: She is attentive. She perceives auditory hallucinations. She does not perceive visual hallucinations.     ?   Mood and Affect: Mood is anxious. Affect is tearful.     ?   Speech: Speech normal.     ?   Behavior: Behavior is cooperative.     ?   Thought Content: Thought content is not paranoid or delusional. Thought content does not include homicidal or suicidal ideation. Thought content does not include homicidal or suicidal plan.  ? ? ?ED Results / Procedures / Treatments   ?Labs ?(all labs ordered are listed, but only abnormal results are displayed) ?Labs Reviewed  ?COMPREHENSIVE METABOLIC PANEL - Abnormal; Notable for the following components:  ?    Result Value  ? Potassium 2.6 (*)   ? Glucose, Bld 124 (*)   ? Total Protein 8.9 (*)   ? All other components within normal limits  ?RAPID URINE DRUG SCREEN, HOSP PERFORMED - Abnormal; Notable for the following components:  ? Tetrahydrocannabinol POSITIVE (*)   ? All other components within normal limits  ?CBC WITH DIFFERENTIAL/PLATELET - Abnormal; Notable for the following components:  ? WBC 13.9 (*)   ? RBC 5.22 (*)   ? Hemoglobin 15.4 (*)   ? Platelets 457 (*)   ? Neutro Abs 10.9 (*)   ? Monocytes Absolute 1.3 (*)   ? All other components within normal limits  ?SALICYLATE LEVEL - Abnormal; Notable for the following components:  ? Salicylate Lvl <7.5 (*)   ? All other components within normal limits  ?ACETAMINOPHEN LEVEL -  Abnormal; Notable for the following components:  ? Acetaminophen (Tylenol), Serum <10 (*)   ? All other components within normal limits  ?BASIC METABOLIC PANEL - Abnormal; Notable for the following components:  ? Potassium 3.3 (*)   ? Glucose, Bld 103 (*)   ? Calcium 8.6 (*)   ? Anion gap 4 (*)   ? All other components within normal limits  ?LIPASE, BLOOD - Abnormal; Notable for the following components:  ? Lipase 76 (*)   ? All other components within normal limits  ?RESP PANEL BY RT-PCR (FLU A&B, COVID) ARPGX2  ?ETHANOL  ?MAGNESIUM  ?I-STAT BETA HCG BLOOD, ED ([facility], [facility], [facility] ONLY)  ?  I-STAT BETA HCG BLOOD, ED ([facility], [facility], [facility] ONLY)  ? ? ?EKG ?None ? ?Radiology ?CT ABDOMEN PELVIS W CONTRAST ? ?Result Date: 12/06/2021 ?CLINICAL DATA:  Epigastric pain EXAM: CT ABDOMEN AND PELVIS WITH CONTRAST TECHNIQUE: Multidetector CT imaging of the abdomen and pelvis was performed using the standard protocol following bolus administration of intravenous contrast. RADIATION DOSE REDUCTION: This exam was performed according to the departmental dose-optimization program which includes automated exposure control, adjustment of the mA and/or kV according to patient size and/or use of iterative reconstruction technique. CONTRAST:  168m OMNIPAQUE IOHEXOL 300 MG/ML  SOLN COMPARISON:  CT abdomen pelvis 05/01/2019, CT abdomen pelvis 06/27/2017, CT abdomen pelvis 10/04/2010 FINDINGS: Lower chest: No acute abnormality. Hepatobiliary: Persistent 2.2 cm hypodensity along the falciform ligament suggestive of focal fatty infiltration. No focal liver abnormality. Status post cholecystectomy. No biliary dilatation. Pancreas: No focal lesion. Normal pancreatic contour. No surrounding inflammatory changes. No main pancreatic ductal dilatation. Spleen: Normal in size without focal abnormality. Adrenals/Urinary Tract: No adrenal nodule bilaterally. Bilateral kidneys enhance symmetrically. Subcentimeter hypodensities. No hydronephrosis. No hydroureter. The  urinary bladder is unremarkable. Stomach/Bowel: Stomach is within normal limits. No evidence of bowel wall thickening or dilatation. Appendix appears normal. Vascular/Lymphatic: Prominent ovarian vasculature. No abdominal

## 2021-12-06 NOTE — Assessment & Plan Note (Signed)
Presenting with severe anxiety and panic attack.  Has been on Paxil for at least 2 months.  Just prescribed Klonopin as needed, she took first dose 3/30 AM.  Patient has chronic history of anxiety/panic attacks with episodes seemingly correlating with menses over the last year. ?-Recommend follow-up with behavioral health ?

## 2021-12-06 NOTE — ED Notes (Signed)
Patient's husband took her clothing, watch, necklace, and shoes home with him ?

## 2021-12-06 NOTE — Consult Note (Signed)
[facility] ?Medical Consultation ? ?Natalie Holloway HEN:[id number] DOB: Dec 14, 1995 DOA: 12/06/2021 ?PCP: Default, Provider, MD  ? ?Requesting physician: Debbe Mounts, PA-C ?Date of consultation: 12/06/2021 ?Reason for consultation: Hypokalemia ? ?Impression/Recommendations ?Principal Problem: ?  Hypokalemia ?Active Problems: ?  Nausea with vomiting ?  Anxiety ? ?Assessment and Plan: ?* Hypokalemia ?Potassium 2.6 on arrival with magnesium 1.9.  Likely related to GI losses from emesis and diarrhea. ?-Continue IV repletion ?-Repeat potassium improved to 3.3, no medical indication to admit for further management at this time ? ?Anxiety ?Presenting with severe anxiety and panic attack.  Has been on Paxil for at least 2 months.  Just prescribed Klonopin as needed, she took first dose 3/30 AM.  Patient has chronic history of anxiety/panic attacks with episodes seemingly correlating with menses over the last year. ?-Recommend follow-up with behavioral health ? ?Nausea with vomiting ?History of chronic episodic nausea and vomiting.  Evaluated by GI in the past, felt to have functional component with anxiety/panic attack induced GI symptoms.  Also could be exacerbated by marijuana use. ?-Lipase only mildly elevated, doubt acute pancreatitis at this time ?-Continue supportive care with IV fluids and antiemetics as needed ?-Consider CT abdomen/pelvis to assess for other medical etiology of symptoms; if negative would recommend follow-up with behavioral health ? ? ?[facility] will sign off. Please contact us if we can be of assistance in the meanwhile. Thank you for this consultation. ? ?Chief Complaint: Anxiety, panic attack ? ?HPI:  ?POETRY CERRO is a 26 y.o. female with medical history significant for GERD, anxiety/depression, PMDD, chronic abdominal pain, marijuana use who presented to the ED for evaluation of anxiety and panic attacks.  History is limited from patient as she is somnolent after receiving IV Ativan and  is otherwise supplemented by EDP, chart review, and patient's spouse and mother at bedside. ? ?Patient's family reports that she has a long history of significant anxiety and intermittent panic attacks.  Over the last year they have noted that they seem to flareup during her menses.  She was started on Paxil by her psychiatrist in January.  Her spouse feels that this did help her over the last 2 months until this past week. ? ?She was seen in [facility] ED on 3/28 for evaluation of nausea, vomiting, and anxiety.  UDS was positive for THC.  She was given 3 L of fluid, Haldol, Reglan, and Benadryl with improvement.  She however had recurrent symptoms over the last 2 days. ? ?Family states that patient was complaining of abdominal pain and has been having associated diarrhea these last 2 weeks.  She has been having intermittent cramping episodes and spouse noted that she had an episode which appeared like her arms were contracting. ? ?He does have a history of chronic abdominal pain with nausea, vomiting evaluated by GI, Dr. Lyndel Safe in the past.  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  Also felt to have likely functional component with episodic nausea/vomiting attributed to panic attacks. ? ?ED Course  Labs/Imaging on admission: I have personally reviewed following labs and imaging studies. ? ?Initial vitals showed BP 176/114, pulse 112, RR 23, temp 97.9 ?F, SPO2 98% room air. ? ?Labs show WBC 13.9, hemoglobin 15.4, platelets 457,000, sodium 135, potassium 3.6, magnesium 1.9, bicarb 27, BUN 7, creatinine 0.58, serum glucose 124, LFTs within normal limits, acetaminophen and salicylate levels undetectable.  Serum ethanol undetectable. ? ?SARS-CoV-2 and influenza PCR negative.  UDS is positive for THC. ? ?Patient  was given 2 L normal saline, IV K 10 mEq x 3, IV Ativan 1 mg and 0.5 mg. ? ?The hospitalist service was consulted for evaluation and management of hypokalemia. ?Review of Systems:   ?All systems reviewed and are negative except as documented in history of present illness above. ? ?Past Medical History:  ?Diagnosis Date  ? Anxiety   ? Crohn's disease ([place])   ? Essential hypertension   ? resolved when discontinued OCP's  ? GERD (gastroesophageal reflux disease)   ? Prilosec  ? Ovarian cyst   ? no current problems as of 10/14/14  ? PONV (postoperative nausea and vomiting)   ? SVD (spontaneous vaginal delivery) 08/13/2018  ? ?Past Surgical History:  ?Procedure Laterality Date  ? CHOLECYSTECTOMY N/A 10/15/2014  ? Procedure: LAPAROSCOPIC CHOLECYSTECTOMY;  Surgeon: Coralie Keens, MD;  Location: [facility];  Service: General;  Laterality: N/A;  ? COLONOSCOPY  10/09/2017  ? Normal colonoscopy to terminal ileum. Incidental nodular hyperplasia of terminal ileum (normal variant)  ? DRUG INDUCED ENDOSCOPY    ? FRENULECTOMY, UPPER LABIAL    ? SMALL BOWEL ENTEROSCOPY  07/10/2017  ? Small hiatal hernia. Mild gastritis. Otherwise normal enteroscopy   ? ?Social History:  reports that she has never smoked. She has never used smokeless tobacco. She reports that she does not drink alcohol and does not use drugs. ? ?No Known Allergies ?Family History  ?Problem Relation Age of Onset  ? Healthy Mother   ? Healthy Father   ? ? ?Prior to Admission medications   ?Medication Sig Start Date End Date Taking? Authorizing Provider  ?acetaminophen (TYLENOL) 325 MG tablet Take 325-650 mg by mouth every 6 (six) hours as needed for mild pain or headache (or cramping).   Yes [provider]  ?clonazePAM (KLONOPIN) 0.5 MG tablet Take 0.25-0.5 mg by mouth every 6 (six) hours as needed (for severe anxiety).   Yes [provider]  ?ibuprofen (ADVIL) 600 MG tablet Take 1 tablet (600 mg total) by mouth every 6 (six) hours. ?Patient taking differently: Take 600 mg by mouth every 6 (six) hours as needed for mild pain, headache or cramping. 04/16/21  Yes Meisinger, Sherren Mocha, MD  ?ondansetron (ZOFRAN-ODT) 4 MG disintegrating tablet  Take 4 mg by mouth every 8 (eight) hours as needed for nausea or vomiting (dissolve orally).   Yes [provider]  ?PARoxetine (PAXIL) 30 MG tablet Take 30 mg by mouth in the morning.   Yes [provider]  ?AMBULATORY NON FORMULARY MEDICATION Medication Name: GI Cocktail ( equal parts of viscous lidocaine, Bentyl, Maalox) ?Take 10 mls four times a day as needed. ?Patient not taking: Reported on 12/06/2021 04/27/19   Jackquline Denmark, MD  ?famotidine (PEPCID) 20 MG tablet Take 1 tablet (20 mg total) by mouth at bedtime. ?Patient not taking: Reported on 12/06/2021 12/15/18   Jackquline Denmark, MD  ?pantoprazole (PROTONIX) 40 MG tablet Take 1 tablet (40 mg total) by mouth daily. ?Patient not taking: Reported on 12/06/2021 04/27/19   Jackquline Denmark, MD  ? ?Physical Exam: ?Blood pressure 107/77, pulse 93, temperature 97.9 ?F (36.6 ?C), temperature source Oral, resp. rate 17, SpO2 96 %, unknown if currently breastfeeding. ?Vitals:  ? 12/06/21 2030 12/06/21 2100  ?BP: 104/74 107/77  ?Pulse: 97 93  ?Resp: 19 17  ?Temp:    ?SpO2: 95% 96%  ?Exam limited due to somnolence after receiving IV Ativan. ?Constitutional: Somnolent, no acute distress ?Eyes: PERRL, lids and conjunctivae normal ?ENMT: Mucous membranes are dry. Posterior pharynx clear  of any exudate or lesions.Normal dentition.  ?Neck: normal, supple, no masses. ?Respiratory: clear to auscultation bilaterally, no wheezing, no crackles. Normal respiratory effort. No accessory muscle use.  ?Cardiovascular: Regular rate and rhythm, no murmurs / rubs / gallops. No extremity edema. 2+ pedal pulses. ?Abdomen: no obvious tenderness elicited, no masses palpated. No hepatosplenomegaly.  ?Musculoskeletal: no clubbing / cyanosis. No joint deformity upper and lower extremities. Good ROM, no contractures. Normal muscle tone.  ?Skin: no rashes, lesions, ulcers. No induration ?Neurologic: Sensation intact. Strength equal bilaterally. ?Psychiatric: Somnolent, will intermittently  arouse and recognizes mother at bedside but not providing further relevant history. ? ?Labs on Admission:  ?Basic Metabolic Panel: ?Recent Labs  ?Lab 12/06/21 ?1531 12/06/21 ?2027  ?NA 135 137  ?K 2.6* 3.3

## 2021-12-07 MED ORDER — LORAZEPAM 1 MG PO TABS
1.0000 mg | ORAL_TABLET | Freq: Four times a day (QID) | ORAL | Status: DC | PRN
Start: 2021-12-07 — End: 2021-12-08
  Administered 2021-12-07: 1 mg via ORAL
  Filled 2021-12-07: qty 1

## 2021-12-07 NOTE — ED Provider Notes (Signed)
I was called to bedside because the patient and her husband were upset and wanting to leave.  Thorough chart review was performed.  Patient was reportedly seen yesterday with concern for anxiety, auditory hallucinations.  No SI/HI.  She was found to have mild hypokalemia, cleared from hospitalist and did not require medical admission.  The impression from the initial provider was that she would require IVC if you tried to leave due to her presentation and concern for acute psychosis. ? ?CTS evaluated the patient, they recommend inpatient treatment.  Currently she is not under IVC.  I went and spoke with the patient and her husband as they are asking to leave.  She seems calm, cooperative.  She is oriented.  Admits to having extreme symptoms yesterday that have resolved.  She is asking to leave.  I explained that it was my instructions to place her under IVC if she tried to leave.  And furthermore the psychiatric services recommended inpatient admission.  I do not feel comfortable reversing either of these based off of my limited interaction with her.  Currently she remains voluntary but I have recommended that TTS reevaluates the patient.  If they feel she is suitable for outpatient follow-up they can provide resources and I would be comfortable with her going as an outpatient.  But until then I would place her under IVC if she attempts to leave.  Remains voluntary with recommended inpatient treatment. ?  Lorelle Gibbs, DO ?12/07/21 2321 ? ?

## 2021-12-07 NOTE — ED Notes (Signed)
Patient came into hall sat in a chair and started crying stating she wanted to take a hot shower to calm her nerves, when told by staff she had to wait to get shower she became upset. This nurse escorted her back into her room and was going to give her a PRN Xanax but she states she can;t take Xanax because it makes her psychotic, provider made aware. ?
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
## 2021-12-07 NOTE — BH Assessment (Addendum)
Ricky Ala NP recommends a inpatient admission to assist with stabilization.  Disposition Counselor referred patient to [facility] for consideration of bed placement. [facility] reviewed patient for admission. Also extended a bed to her as long as IP reviews her chart and deems that she is appropriate for [facility]. The chart reflects that the patient has been + for nausea. Patient under review by Dr. Baxter Flattery at this time.  ?

## 2021-12-07 NOTE — ED Provider Notes (Signed)
Emergency Medicine Observation Re-evaluation Note ? ?Natalie Holloway is a 26 y.o. female, seen on rounds today.  Pt initially presented to the ED for complaints of Anxiety ?Currently, the patient is calm appearing, but she was tearful earlier in the day. ? ?Physical Exam  ?BP (!) 142/111 (BP Location: Right Arm)   Pulse 86   Temp 97.9 ?F (36.6 ?C) (Oral)   Resp 16   SpO2 100%  ?Physical Exam ?General: no acute distress ? ? ?ED Course / MDM  ?EKG:EKG Interpretation ? ?Date/Time:  Thursday December 06 2021 22:41:11 EDT ?Ventricular Rate:  99 ?PR Interval:  140 ?QRS Duration: 87 ?QT Interval:  361 ?QTC Calculation: 464 ?R Axis:   92 ?Text Interpretation: Sinus rhythm Borderline right axis deviation When compared with ECG of EARLIER SAME DATE Premature ventricular complexes are no longer present Confirmed by Delora Fuel ([id number]) on 12/07/2021 6:28:09 AM ? ?I have reviewed the labs performed to date as well as medications administered while in observation.  Recent changes in the last 24 hours include : none, needs psych assessment. ? ?Plan  ?Current plan is for psych evaluation. ? Zira KANASIA GAYMAN is not under involuntary commitment. ? ? ?  ?Varney Biles, MD ?12/07/21 1258 ? ?

## 2021-12-08 MED ORDER — POTASSIUM CHLORIDE CRYS ER 20 MEQ PO TBCR
40.0000 meq | EXTENDED_RELEASE_TABLET | Freq: Once | ORAL | Status: AC
Start: 2021-12-08 — End: 2021-12-08
  Administered 2021-12-08: 40 meq via ORAL
  Filled 2021-12-08: qty 2

## 2021-12-08 NOTE — ED Provider Notes (Signed)
Emergency Medicine Observation Re-evaluation Note ? ?[name] is a 26 y.o. female, seen on rounds today.  Pt initially presented to the ED for complaints of Anxiety ?Pt notes hx stress/anxiety and panic attacks, and indicates is feeling much improved in past 24 hours. She is eating/drinking, good appetite, stress/anxiety down, and denies any thoughts of harm to self or others.  ? ?Physical Exam  ?BP 96/63 (BP Location: Left Arm)   Pulse 88   Temp (!) 97.4 ?F (36.3 ?C) (Oral)   Resp 20   SpO2 100%  ?Physical Exam ?General: alert, content. ?Cardiac: regular rate ?Lungs: breathing comfortably ?Psych: alert, content, pleasant, conversant. Pt does not appear acutely depressed or despondent. No thoughts of harm to self or others. Pt is not responding to internal stimuli - no delusions or hallucinations noted.  ? ?ED Course / MDM  ? ? ?I have reviewed the labs performed to date as well as medications administered while in observation.  Recent changes in the last 24 hours include ED obs, med management, reassessment.  ? ?Plan  ? ? Natalie Holloway is not under involuntary commitment. ? ?Pt reassessed. Reports feeling much improved - notes from last evening note similar. Pt has normal mood and affect. No SI/HI. No delusions, hallucinations or psychosis noted.  ? ?Po fluids/breakfast provided.  ? ?RN to contact family/spouse for transport.  ? ?Pt currently appears stable for d/c.  ? ?Rec close outpatient pcp/bh f/u. ? ?Return precautions provided.  ? ? ? ? ?  ?Lajean Saver, MD ?12/08/21 [phone] ? ?

## 2021-12-08 NOTE — Discharge Instructions (Addendum)
It was our pleasure to provide your ER care today - we hope that you feel better. ? ?Drink plenty of fluids/stay well hydrated, get adequate nutrition.  ? ?From your labs, your potassium level is low - make sure to eat plenty of fruits and vegetables, and follow up with primary care doctor in 1-2 weeks.  ? ?Also follow up closely with behavioral health provider this coming week. ? ?For mental health issues and/or crisis, you may also go directly to the [facility] - it is open 24/7 and walk-ins are welcome.  ? ?Return to ER if worse, new symptoms, trouble breathing, or other concern.  ?

## 2021-12-08 NOTE — ED Notes (Signed)
Patient sleeping comfortably.  ?

## 2021-12-08 NOTE — ED Notes (Signed)
Patient DC d off unit to home per provider. Patient alert, calm, cooperative, no s/s of distress. DC information given to and reviewed with patient, with acknowledged understanding. Belongings given to patient. Patient ambulatory off unit, escorted by RN.  Patient transported by husband.  ?

## 2021-12-10 ENCOUNTER — Telehealth (HOSPITAL_COMMUNITY): Payer: Self-pay | Admitting: Psychiatry

## 2021-12-10 NOTE — Telephone Encounter (Signed)
D:  Returned pt's call; states she was referred per [facility] to [facility].  A:  Oriented pt.  Encouraged pt to verify her insurance benefits.  Pt states she will call [employer], along with talking to her husband and her job first.  Encouraged pt to give cm a call afterwards.  R:  Pt receptive. ?

## 2022-07-16 DIAGNOSIS — N390 Urinary tract infection, site not specified: Secondary | ICD-10-CM | POA: Diagnosis not present

## 2022-07-17 DIAGNOSIS — F410 Panic disorder [episodic paroxysmal anxiety] without agoraphobia: Secondary | ICD-10-CM | POA: Diagnosis not present

## 2022-07-17 DIAGNOSIS — F411 Generalized anxiety disorder: Secondary | ICD-10-CM | POA: Diagnosis not present

## 2022-07-30 DIAGNOSIS — F411 Generalized anxiety disorder: Secondary | ICD-10-CM | POA: Diagnosis not present

## 2022-07-30 DIAGNOSIS — F410 Panic disorder [episodic paroxysmal anxiety] without agoraphobia: Secondary | ICD-10-CM | POA: Diagnosis not present

## 2022-08-15 DIAGNOSIS — F411 Generalized anxiety disorder: Secondary | ICD-10-CM | POA: Diagnosis not present

## 2022-08-15 DIAGNOSIS — F410 Panic disorder [episodic paroxysmal anxiety] without agoraphobia: Secondary | ICD-10-CM | POA: Diagnosis not present

## 2022-08-27 DIAGNOSIS — F410 Panic disorder [episodic paroxysmal anxiety] without agoraphobia: Secondary | ICD-10-CM | POA: Diagnosis not present

## 2022-08-27 DIAGNOSIS — F411 Generalized anxiety disorder: Secondary | ICD-10-CM | POA: Diagnosis not present

## 2022-09-18 DIAGNOSIS — F411 Generalized anxiety disorder: Secondary | ICD-10-CM | POA: Diagnosis not present

## 2022-09-18 DIAGNOSIS — F410 Panic disorder [episodic paroxysmal anxiety] without agoraphobia: Secondary | ICD-10-CM | POA: Diagnosis not present

## 2022-10-08 DIAGNOSIS — F410 Panic disorder [episodic paroxysmal anxiety] without agoraphobia: Secondary | ICD-10-CM | POA: Diagnosis not present

## 2022-10-08 DIAGNOSIS — F411 Generalized anxiety disorder: Secondary | ICD-10-CM | POA: Diagnosis not present

## 2022-10-22 DIAGNOSIS — F410 Panic disorder [episodic paroxysmal anxiety] without agoraphobia: Secondary | ICD-10-CM | POA: Diagnosis not present

## 2022-10-22 DIAGNOSIS — F411 Generalized anxiety disorder: Secondary | ICD-10-CM | POA: Diagnosis not present

## 2022-10-26 DIAGNOSIS — F411 Generalized anxiety disorder: Secondary | ICD-10-CM | POA: Diagnosis not present

## 2022-10-26 DIAGNOSIS — F410 Panic disorder [episodic paroxysmal anxiety] without agoraphobia: Secondary | ICD-10-CM | POA: Diagnosis not present

## 2022-11-07 DIAGNOSIS — F410 Panic disorder [episodic paroxysmal anxiety] without agoraphobia: Secondary | ICD-10-CM | POA: Diagnosis not present

## 2022-11-07 DIAGNOSIS — F411 Generalized anxiety disorder: Secondary | ICD-10-CM | POA: Diagnosis not present

## 2022-11-19 DIAGNOSIS — F410 Panic disorder [episodic paroxysmal anxiety] without agoraphobia: Secondary | ICD-10-CM | POA: Diagnosis not present

## 2022-11-19 DIAGNOSIS — F411 Generalized anxiety disorder: Secondary | ICD-10-CM | POA: Diagnosis not present

## 2022-12-05 DIAGNOSIS — F411 Generalized anxiety disorder: Secondary | ICD-10-CM | POA: Diagnosis not present

## 2022-12-05 DIAGNOSIS — F410 Panic disorder [episodic paroxysmal anxiety] without agoraphobia: Secondary | ICD-10-CM | POA: Diagnosis not present

## 2022-12-23 DIAGNOSIS — F410 Panic disorder [episodic paroxysmal anxiety] without agoraphobia: Secondary | ICD-10-CM | POA: Diagnosis not present

## 2022-12-23 DIAGNOSIS — F411 Generalized anxiety disorder: Secondary | ICD-10-CM | POA: Diagnosis not present

## 2023-01-01 DIAGNOSIS — F410 Panic disorder [episodic paroxysmal anxiety] without agoraphobia: Secondary | ICD-10-CM | POA: Diagnosis not present

## 2023-01-01 DIAGNOSIS — F411 Generalized anxiety disorder: Secondary | ICD-10-CM | POA: Diagnosis not present

## 2023-01-07 DIAGNOSIS — F411 Generalized anxiety disorder: Secondary | ICD-10-CM | POA: Diagnosis not present

## 2023-01-07 DIAGNOSIS — F410 Panic disorder [episodic paroxysmal anxiety] without agoraphobia: Secondary | ICD-10-CM | POA: Diagnosis not present

## 2023-01-22 DIAGNOSIS — F411 Generalized anxiety disorder: Secondary | ICD-10-CM | POA: Diagnosis not present

## 2023-01-22 DIAGNOSIS — F410 Panic disorder [episodic paroxysmal anxiety] without agoraphobia: Secondary | ICD-10-CM | POA: Diagnosis not present

## 2023-02-05 DIAGNOSIS — F411 Generalized anxiety disorder: Secondary | ICD-10-CM | POA: Diagnosis not present

## 2023-02-05 DIAGNOSIS — F410 Panic disorder [episodic paroxysmal anxiety] without agoraphobia: Secondary | ICD-10-CM | POA: Diagnosis not present

## 2023-03-03 DIAGNOSIS — F411 Generalized anxiety disorder: Secondary | ICD-10-CM | POA: Diagnosis not present

## 2023-03-03 DIAGNOSIS — F410 Panic disorder [episodic paroxysmal anxiety] without agoraphobia: Secondary | ICD-10-CM | POA: Diagnosis not present

## 2023-03-24 DIAGNOSIS — F411 Generalized anxiety disorder: Secondary | ICD-10-CM | POA: Diagnosis not present

## 2023-03-24 DIAGNOSIS — F410 Panic disorder [episodic paroxysmal anxiety] without agoraphobia: Secondary | ICD-10-CM | POA: Diagnosis not present

## 2023-04-28 DIAGNOSIS — F410 Panic disorder [episodic paroxysmal anxiety] without agoraphobia: Secondary | ICD-10-CM | POA: Diagnosis not present

## 2023-04-28 DIAGNOSIS — F411 Generalized anxiety disorder: Secondary | ICD-10-CM | POA: Diagnosis not present

## 2023-05-22 DIAGNOSIS — F411 Generalized anxiety disorder: Secondary | ICD-10-CM | POA: Diagnosis not present

## 2023-05-22 DIAGNOSIS — F410 Panic disorder [episodic paroxysmal anxiety] without agoraphobia: Secondary | ICD-10-CM | POA: Diagnosis not present

## 2023-06-24 DIAGNOSIS — F410 Panic disorder [episodic paroxysmal anxiety] without agoraphobia: Secondary | ICD-10-CM | POA: Diagnosis not present

## 2023-06-24 DIAGNOSIS — F411 Generalized anxiety disorder: Secondary | ICD-10-CM | POA: Diagnosis not present

## 2023-07-29 DIAGNOSIS — F410 Panic disorder [episodic paroxysmal anxiety] without agoraphobia: Secondary | ICD-10-CM | POA: Diagnosis not present

## 2023-09-16 DIAGNOSIS — F411 Generalized anxiety disorder: Secondary | ICD-10-CM | POA: Diagnosis not present

## 2023-09-16 DIAGNOSIS — F410 Panic disorder [episodic paroxysmal anxiety] without agoraphobia: Secondary | ICD-10-CM | POA: Diagnosis not present

## 2023-11-05 DIAGNOSIS — R109 Unspecified abdominal pain: Secondary | ICD-10-CM | POA: Diagnosis not present

## 2023-11-05 DIAGNOSIS — R300 Dysuria: Secondary | ICD-10-CM | POA: Diagnosis not present

## 2023-11-05 DIAGNOSIS — N390 Urinary tract infection, site not specified: Secondary | ICD-10-CM | POA: Diagnosis not present

## 2023-11-07 IMAGING — CT CT ABD-PELV W/ CM
2 of 4 series · 16 of 46 positions shown, 18 images · IV contrast (agent unspecified)
Comparison: CT abdomen pelvis 05/01/2019, CT abdomen pelvis
06/27/2017, CT abdomen pelvis 10/04/2010

CLINICAL DATA: Epigastric pain

EXAM:
CT ABDOMEN AND PELVIS WITH CONTRAST
TECHNIQUE: Multidetector CT imaging of the abdomen and pelvis was performed
using the standard protocol following bolus administration of
intravenous contrast.

[Series 2: axial st · axial · 0.98mm/px · z∈[+986,+1396]mm · 13 of 94 slices shown, 15 images]
[im 6/94  soft-tissue]
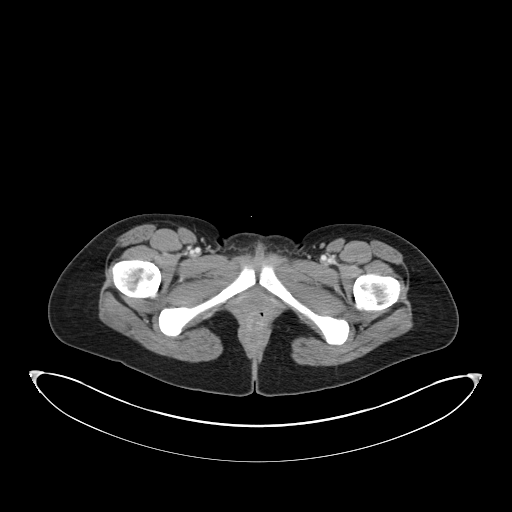
[im 6/94  bone]
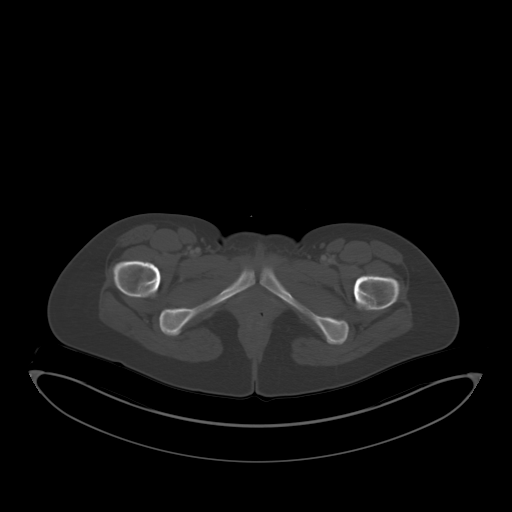
[im 11/94  soft-tissue]
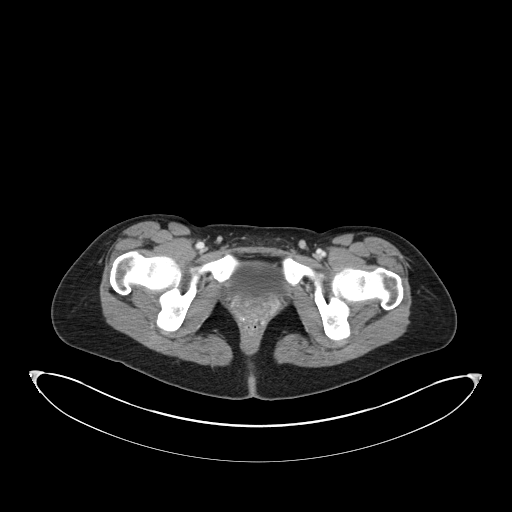
[im 22/94  soft-tissue]
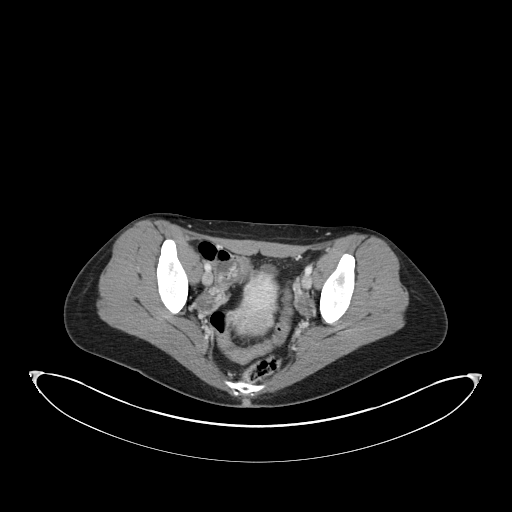
[im 28/94  soft-tissue]
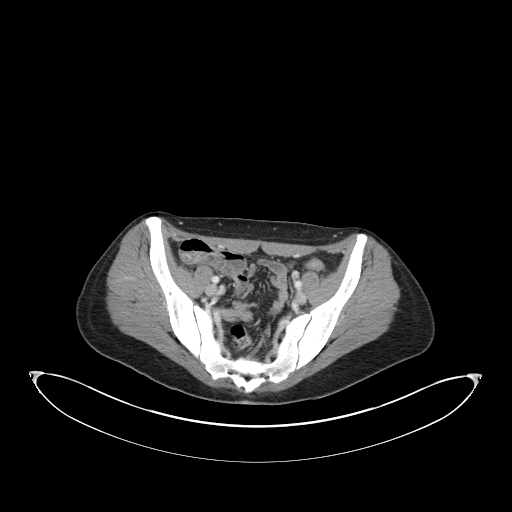
[im 33/94  soft-tissue]
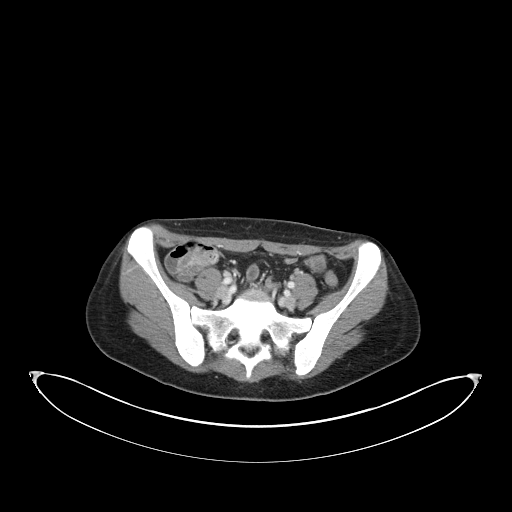
[im 39/94  soft-tissue]
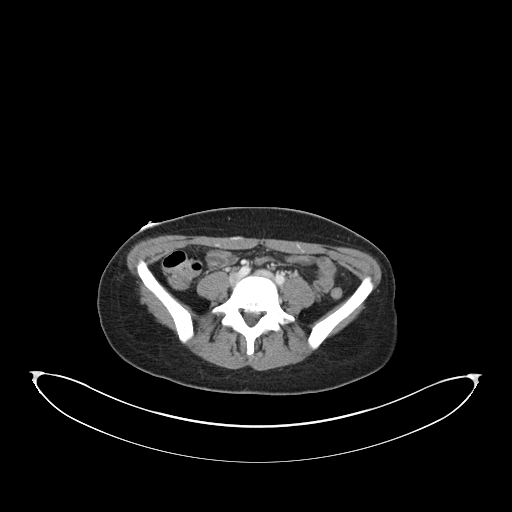
[im 50/94  soft-tissue]
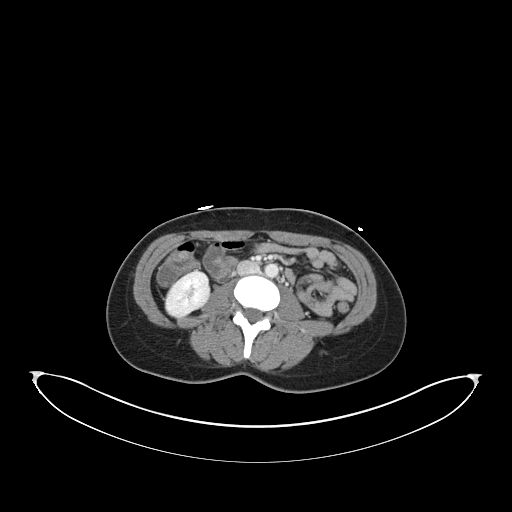
[im 55/94  soft-tissue]
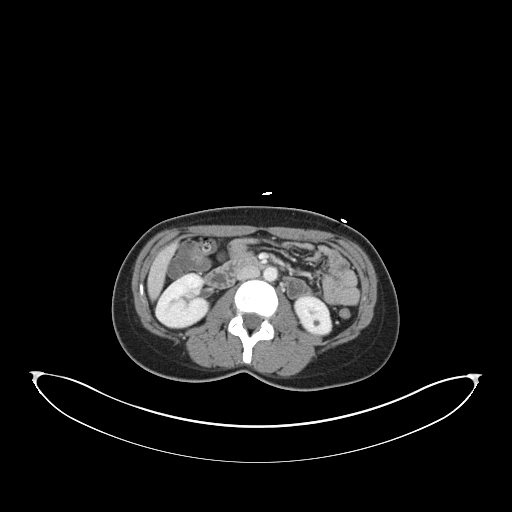
[im 61/94  soft-tissue]
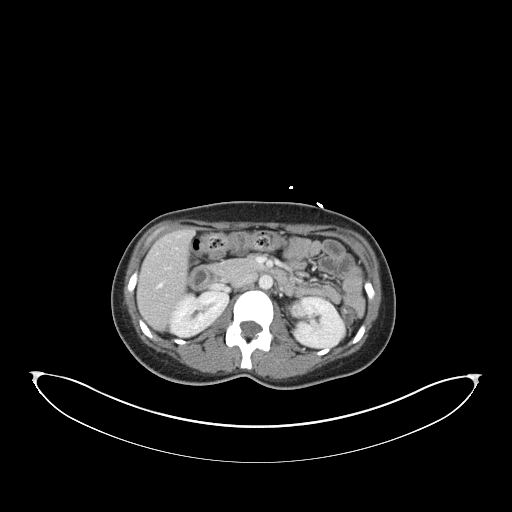
[im 61/94  bone]
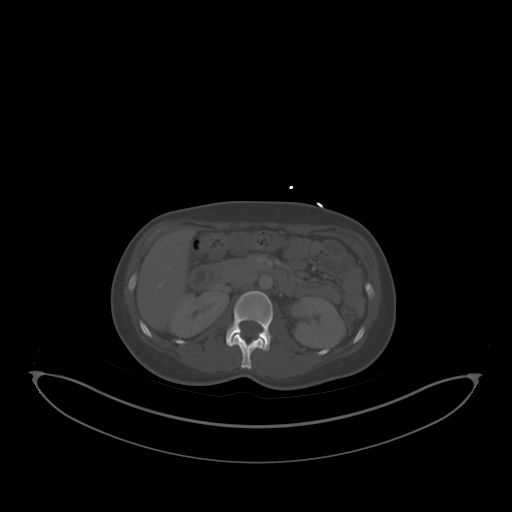
[im 66/94  soft-tissue]
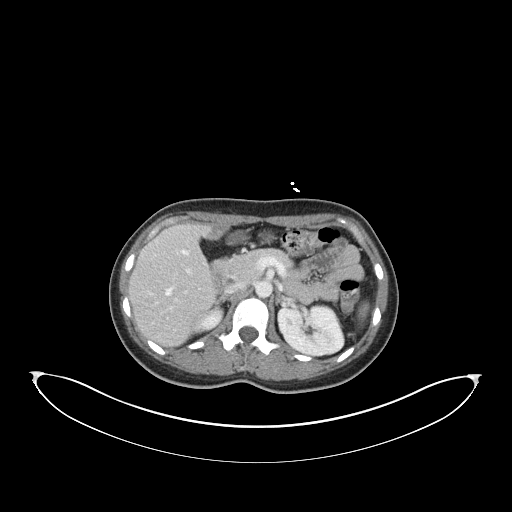
[im 72/94  soft-tissue]
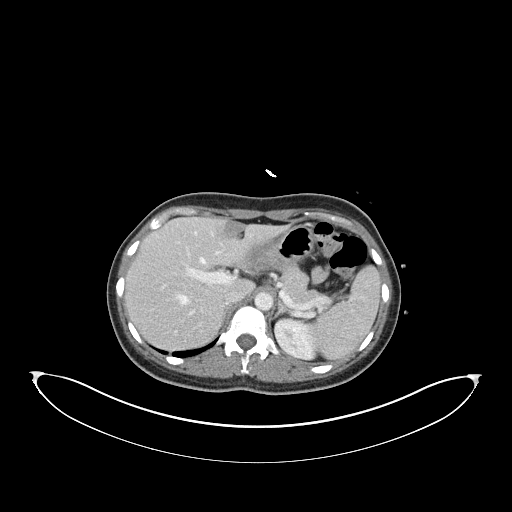
[im 83/94  soft-tissue]
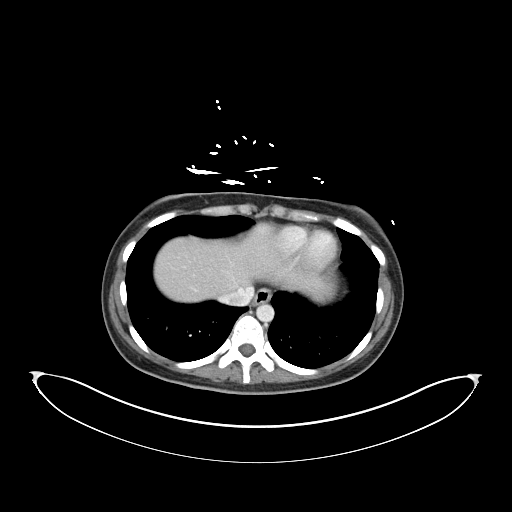
[im 88/94  soft-tissue]
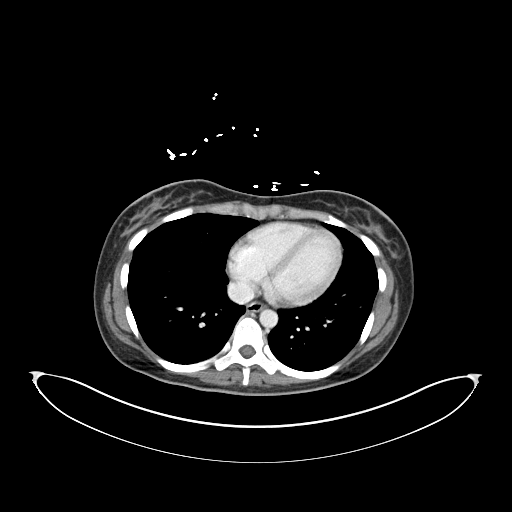

[Series 5: coronal st · coronal · 0.77mm/px · 3 of 148 slices shown]
[im 50/148  soft-tissue]
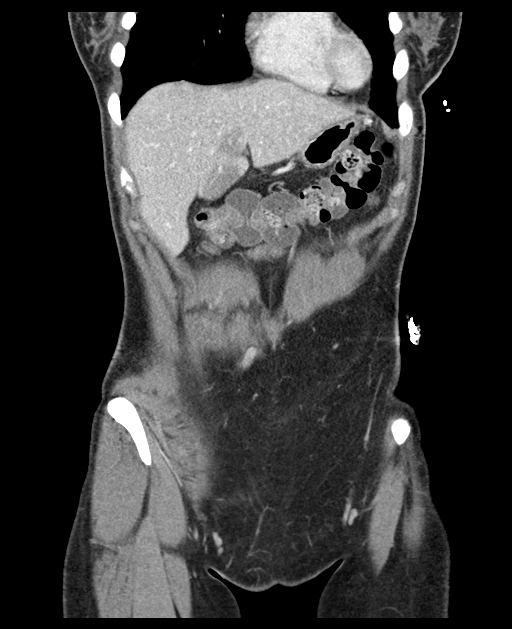
[im 66/148  soft-tissue]
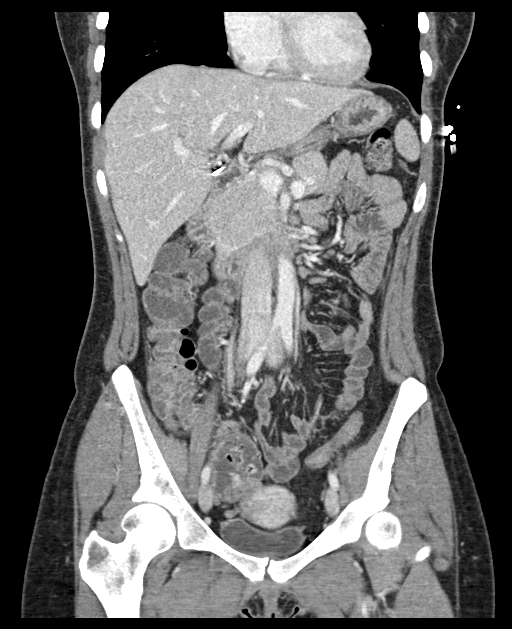
[im 82/148  soft-tissue]
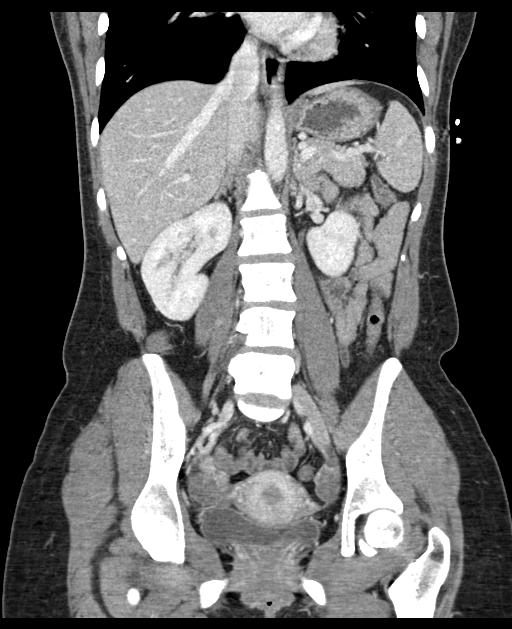

[16 of 46 positions shown; findings below may reference images not displayed]

RADIATION DOSE REDUCTION: This exam was performed according to the
departmental dose-optimization program which includes automated
exposure control, adjustment of the mA and/or kV according to
patient size and/or use of iterative reconstruction technique.

CONTRAST:  100mL OMNIPAQUE IOHEXOL 300 MG/ML  SOLN
FINDINGS: Lower chest: No acute abnormality.

Hepatobiliary: Persistent 2.2 cm hypodensity along the falciform
ligament suggestive of focal fatty infiltration. No focal liver
abnormality. Status post cholecystectomy. No biliary dilatation.

Pancreas: No focal lesion. Normal pancreatic contour. No surrounding
inflammatory changes. No main pancreatic ductal dilatation.

Spleen: Normal in size without focal abnormality.

Adrenals/Urinary Tract:

No adrenal nodule bilaterally.

Bilateral kidneys enhance symmetrically. Subcentimeter
hypodensities.

No hydronephrosis. No hydroureter.

The urinary bladder is unremarkable.

Stomach/Bowel: Stomach is within normal limits. No evidence of bowel
wall thickening or dilatation. Appendix appears normal.

Vascular/Lymphatic: Prominent ovarian vasculature. No abdominal
aorta or iliac aneurysm. Mild atherosclerotic plaque of the aorta
and its branches. No abdominal, pelvic, or inguinal lymphadenopathy.

Reproductive: Uterus and bilateral adnexa are unremarkable.

Other: No intraperitoneal free fluid. No intraperitoneal free gas.
No organized fluid collection.

Musculoskeletal:

No abdominal wall hernia or abnormality.

No suspicious lytic or blastic osseous lesions. No acute displaced
fracture. Multilevel degenerative changes of the spine.
IMPRESSION: No acute intra-abdominal or intrapelvic abnormality.

## 2023-11-18 DIAGNOSIS — F410 Panic disorder [episodic paroxysmal anxiety] without agoraphobia: Secondary | ICD-10-CM | POA: Diagnosis not present

## 2023-11-18 DIAGNOSIS — F411 Generalized anxiety disorder: Secondary | ICD-10-CM | POA: Diagnosis not present

## 2024-01-28 DIAGNOSIS — F411 Generalized anxiety disorder: Secondary | ICD-10-CM | POA: Diagnosis not present

## 2024-01-28 DIAGNOSIS — F410 Panic disorder [episodic paroxysmal anxiety] without agoraphobia: Secondary | ICD-10-CM | POA: Diagnosis not present

## 2024-03-30 DIAGNOSIS — Z130 Encounter for screening for diseases of the blood and blood-forming organs and certain disorders involving the immune mechanism: Secondary | ICD-10-CM | POA: Diagnosis not present

## 2024-03-30 DIAGNOSIS — Z1151 Encounter for screening for human papillomavirus (HPV): Secondary | ICD-10-CM | POA: Diagnosis not present

## 2024-03-30 DIAGNOSIS — Z01419 Encounter for gynecological examination (general) (routine) without abnormal findings: Secondary | ICD-10-CM | POA: Diagnosis not present

## 2024-03-30 DIAGNOSIS — Z124 Encounter for screening for malignant neoplasm of cervix: Secondary | ICD-10-CM | POA: Diagnosis not present

## 2024-04-07 DIAGNOSIS — Z13220 Encounter for screening for lipoid disorders: Secondary | ICD-10-CM | POA: Diagnosis not present

## 2024-04-07 DIAGNOSIS — Z13228 Encounter for screening for other metabolic disorders: Secondary | ICD-10-CM | POA: Diagnosis not present

## 2024-04-20 DIAGNOSIS — Z833 Family history of diabetes mellitus: Secondary | ICD-10-CM | POA: Diagnosis not present

## 2024-04-20 DIAGNOSIS — R102 Pelvic and perineal pain: Secondary | ICD-10-CM | POA: Diagnosis not present

## 2024-06-02 DIAGNOSIS — R7309 Other abnormal glucose: Secondary | ICD-10-CM | POA: Diagnosis not present

## 2024-07-07 DIAGNOSIS — L219 Seborrheic dermatitis, unspecified: Secondary | ICD-10-CM | POA: Diagnosis not present
